# Patient Record
Sex: Male | Born: 1960 | Race: White | Hispanic: No | Marital: Married | Smoking: Former smoker
Health system: Southern US, Community
[De-identification: ages and names within clinical notes are randomized; demographics above are authoritative.]

## PROBLEM LIST (undated history)

## (undated) DIAGNOSIS — Z9889 Other specified postprocedural states: Secondary | ICD-10-CM

## (undated) DIAGNOSIS — M1990 Unspecified osteoarthritis, unspecified site: Secondary | ICD-10-CM

## (undated) DIAGNOSIS — E7800 Pure hypercholesterolemia, unspecified: Secondary | ICD-10-CM

## (undated) DIAGNOSIS — G4730 Sleep apnea, unspecified: Secondary | ICD-10-CM

## (undated) DIAGNOSIS — D649 Anemia, unspecified: Secondary | ICD-10-CM

## (undated) DIAGNOSIS — R112 Nausea with vomiting, unspecified: Secondary | ICD-10-CM

## (undated) DIAGNOSIS — J189 Pneumonia, unspecified organism: Secondary | ICD-10-CM

## (undated) DIAGNOSIS — E039 Hypothyroidism, unspecified: Secondary | ICD-10-CM

## (undated) DIAGNOSIS — T4145XA Adverse effect of unspecified anesthetic, initial encounter: Secondary | ICD-10-CM

## (undated) DIAGNOSIS — T8859XA Other complications of anesthesia, initial encounter: Secondary | ICD-10-CM

## (undated) DIAGNOSIS — I4891 Unspecified atrial fibrillation: Secondary | ICD-10-CM

## (undated) DIAGNOSIS — L4050 Arthropathic psoriasis, unspecified: Secondary | ICD-10-CM

## (undated) DIAGNOSIS — F419 Anxiety disorder, unspecified: Secondary | ICD-10-CM

## (undated) DIAGNOSIS — I10 Essential (primary) hypertension: Secondary | ICD-10-CM

## (undated) HISTORY — PX: ROTATOR CUFF REPAIR: SHX139

## (undated) HISTORY — PX: EYE SURGERY: SHX253

## (undated) HISTORY — PX: JOINT REPLACEMENT: SHX530

## (undated) HISTORY — PX: TONSILLECTOMY: SUR1361

## (undated) HISTORY — PX: HAND SURGERY: SHX662

---

## 2000-02-15 ENCOUNTER — Ambulatory Visit (HOSPITAL_BASED_OUTPATIENT_CLINIC_OR_DEPARTMENT_OTHER): Admission: RE | Admit: 2000-02-15 | Discharge: 2000-02-15 | Payer: Self-pay | Admitting: Otolaryngology

## 2010-02-14 ENCOUNTER — Encounter: Admission: RE | Admit: 2010-02-14 | Discharge: 2010-02-14 | Payer: Self-pay | Admitting: Orthopedic Surgery

## 2010-03-11 ENCOUNTER — Encounter: Admission: RE | Admit: 2010-03-11 | Discharge: 2010-03-11 | Payer: Self-pay | Admitting: Orthopedic Surgery

## 2011-01-06 NOTE — Op Note (Signed)
[place]  Patient:    Sean Miles, Sean Miles                         MRN: [id number] Proc. Date: 02/15/00 Adm. Date:  [id number] Attending:  Milly Jakob Dictator:   Harvie Junior, M.D. CC:         Harvie Junior, M.D.                           Operative Report  PREOPERATIVE DIAGNOSIS:  Degenerative joint disease with medial meniscal tear.  POSTOPERATIVE DIAGNOSIS:  Degenerative joint disease with medial meniscal tear.  PROCEDURE: 1. Partial posterior horn medial meniscectomy. 2. Medial femoral chondroplasty.  SURGEON:  Dr. Luiz Blare.  ANESTHESIA:  General.  HISTORY OF PRESENT ILLNESS:  He is a 50 year old male with a long history of bilateral knee pain.  He has had intermittent pain of both knees with occasional episodes of feeling of locking, giving out, occasional swelling off and on.  Because it continued and persistent complaints of pain, he came to the operating room for evaluation under anesthesia and arthroscopy.  DESCRIPTION OF PROCEDURE:  The patient was brought to the operating room, and after adequate anesthesia was obtained with a general anesthetic the patient was placed supine on the operating table.  The right leg was then prepped and draped in the usual sterile fashion.  Following this, routine arthroscopic examination of the knee revealed that there was some lateral tracking of the patella, although no chondromalacia of the lateral femoral condyle or in the lateral patellar facet.  Attention was then turned down to the medial weightbearing compartment where there was noted to be significant flaps of cartilage in the medial femoral condyle with chondromalacia.  This is a standard location for degenerative arthritis.  This was debrided with a suction shaver to a smooth surface, and the leg was flexed completely to get a very good look at the femoral condyle.  This was a fairly large area, probably the size of a silver dollar.   Following this, attention was turned to the medial meniscus where there was a posterior horn radial tear causing the posterior horn to be unstable.  This was debrided with a combination of the straight-biting forceps and a up-biting forceps and the remaining ______ was contoured down with a suction shaver.  Following this, attention was turned to the notch where the anterior cruciate was noted to be within normal limits. The lateral femoral condyle was noted to be within normal limits except for one little small nick.  The lateral meniscus was without abnormality.  At this point the knee was copiously irrigated and suctioned dry.  Final check was made for any loose fragment and pieces.  The portals were then closed with Steri-Strips, 20 cc of 0.25% Marcaine was instilled for postoperative anesthesia.  A gentle compressive dressing was then applied.  The patient was taken to the recovery room and noted to be in satisfactory condition.  ESTIMATED BLOOD LOSS:  None. DD:  02/15/00 TD:  02/16/00 Job: [id number]

## 2011-04-28 ENCOUNTER — Other Ambulatory Visit: Payer: Self-pay | Admitting: Orthopedic Surgery

## 2011-04-28 DIAGNOSIS — M545 Low back pain, unspecified: Secondary | ICD-10-CM

## 2011-05-15 ENCOUNTER — Ambulatory Visit
Admission: RE | Admit: 2011-05-15 | Discharge: 2011-05-15 | Disposition: A | Discharge status: Home / Self Care | Payer: 59 | Source: Ambulatory Visit | Attending: Orthopedic Surgery | Admitting: Orthopedic Surgery

## 2011-05-15 DIAGNOSIS — M545 Low back pain, unspecified: Secondary | ICD-10-CM

## 2011-07-06 ENCOUNTER — Encounter (HOSPITAL_COMMUNITY): Payer: Self-pay | Admitting: Pharmacy Technician

## 2011-07-11 ENCOUNTER — Encounter (HOSPITAL_COMMUNITY)
Admission: RE | Admit: 2011-07-11 | Discharge: 2011-07-11 | Disposition: A | Discharge status: Home / Self Care | Payer: 59 | Source: Ambulatory Visit | Attending: Orthopedic Surgery | Admitting: Orthopedic Surgery

## 2011-07-11 ENCOUNTER — Encounter (HOSPITAL_COMMUNITY): Payer: Self-pay

## 2011-07-11 ENCOUNTER — Encounter (HOSPITAL_COMMUNITY)
Admission: RE | Admit: 2011-07-11 | Discharge: 2011-07-11 | Disposition: A | Discharge status: Home / Self Care | Payer: 59 | Source: Ambulatory Visit | Attending: Physician Assistant | Admitting: Physician Assistant

## 2011-07-11 HISTORY — DX: Pneumonia, unspecified organism: J18.9

## 2011-07-11 HISTORY — DX: Essential (primary) hypertension: I10

## 2011-07-11 HISTORY — DX: Pure hypercholesterolemia, unspecified: E78.00

## 2011-07-11 HISTORY — DX: Adverse effect of unspecified anesthetic, initial encounter: T41.45XA

## 2011-07-11 HISTORY — DX: Unspecified osteoarthritis, unspecified site: M19.90

## 2011-07-11 HISTORY — DX: Anxiety disorder, unspecified: F41.9

## 2011-07-11 HISTORY — DX: Sleep apnea, unspecified: G47.30

## 2011-07-11 HISTORY — DX: Hypothyroidism, unspecified: E03.9

## 2011-07-11 HISTORY — DX: Other complications of anesthesia, initial encounter: T88.59XA

## 2011-07-11 LAB — CBC
Hemoglobin: 14.5 g/dL (ref 13.0–17.0)
MCH: 30.3 pg (ref 26.0–34.0)
MCHC: 34.9 g/dL (ref 30.0–36.0)
MCV: 86.8 fL (ref 78.0–100.0)
Platelets: 339 10*3/uL (ref 150–400)
RBC: 4.78 MIL/uL (ref 4.22–5.81)

## 2011-07-11 LAB — COMPREHENSIVE METABOLIC PANEL
CO2: 26 mEq/L (ref 19–32)
Calcium: 10 mg/dL (ref 8.4–10.5)
Creatinine, Ser: 0.83 mg/dL (ref 0.50–1.35)
GFR calc Af Amer: >90 mL/min (ref >90)
GFR calc non Af Amer: >90 mL/min (ref >90)
Glucose, Bld: 104 mg/dL — ABNORMAL HIGH (ref 70–99)
Sodium: 135 mEq/L (ref 135–145)
Total Protein: 7.4 g/dL (ref 6.0–8.3)

## 2011-07-11 LAB — DIFFERENTIAL
Basophils Absolute: 0 10*3/uL (ref 0.0–0.1)
Lymphocytes Relative: 37 % (ref 12–46)
Lymphs Abs: 3.5 10*3/uL (ref 0.7–4.0)
Neutro Abs: 5 10*3/uL (ref 1.7–7.7)

## 2011-07-11 LAB — PROTIME-INR
INR: 1 (ref 0.00–1.49)
Prothrombin Time: 13.4 seconds (ref 11.6–15.2)

## 2011-07-11 LAB — SURGICAL PCR SCREEN
MRSA, PCR: NEGATIVE
Staphylococcus aureus: NEGATIVE

## 2011-07-11 NOTE — Pre-Procedure Instructions (Signed)
20 TAFT WORTHING  07/11/2011   Your procedure is scheduled on:  Wednesday July 19, 2011  Report to [facility] Stay [facility] at 0630 AM.  Call this number if you have problems the morning of surgery: [phone]   Remember:   Do not eat food:After Midnight.  Do not drink clear liquids: 4 Hours before arrival. (may have clear liquids:  Soda, tea, black coffee, grape juice, apple juice, broth, water up to 2:30am)  Take these medicines the morning of surgery with A SIP OF WATER: amlodipine, fenofibrate, hydrocodone, armour, xanax   Do not wear jewelry, make-up or nail polish.  Do not wear lotions, powders, or perfumes. You may wear deodorant.  Do not shave 48 hours prior to surgery.  Do not bring valuables to the hospital.  Contacts, dentures or bridgework may not be worn into surgery.  Leave suitcase in the car. After surgery it may be brought to your room.  For patients admitted to the hospital, checkout time is 11:00 AM the day of discharge.   Patients discharged the day of surgery will not be allowed to drive home.  Name and phone number of your driver: Kiyon Fidalgo [phone]  Special Instructions: CHG Shower Use Special Wash: 1/2 bottle night before surgery and 1/2 bottle morning of surgery.   Please read over the following fact sheets that you were given: Pain Booklet, Coughing and Deep Breathing, Blood Transfusion Information, MRSA Information and Surgical Site Infection Prevention  [phone]

## 2011-07-11 NOTE — Progress Notes (Signed)
Ekg on chart from 06/12/2011 from primary care office Dr. Hyacinth Meeker.

## 2011-07-11 NOTE — H&P (Signed)
Sean Miles 07/11/2011 8:17 AM Location: [facility] Patient #: [id number] DOB: 1961-04-24 Married / Language: [place] / Race: White Male   History of Present Illness(Iza Preston Dierdre Highman, PA-C; 07/11/2011 9:40 AM) The patient is a 50 year old male who comes in today for a preoperative History and Physical. The patient is scheduled for a XLIF with supplemental PSFI (for back and predominately right leg pain) to be performed by Dr. Debria Garret D. Shon Baton, MD at [facility] on Wednesday, July 19, 2011 at 8:30AM .    Family History(Jarren Para J [facility], PA-C; 07/11/2011 9:47 AM) Cerebrovascular Accident. father Chronic Obstructive Lung Disease. mother Congestive Heart Failure. mother Diabetes Mellitus. father Heart disease in male family member before age 48 Hypertension. father Osteoarthritis. mother Rheumatoid Arthritis. mother Severe allergy. sister   Social History(Donyale Berthold J [facility], PA-C; 07/11/2011 9:47 AM) Alcohol use. Moderate alcohol use. current drinker; drinks beer; 8-14 per week Children. 2 Current work status. working full time Drug/Alcohol Rehab (Currently). no Drug/Alcohol Rehab (Previously). no Exercise. Exercises rarely; does running / walking and individual sport Illicit drug use. no Living situation. live with spouse Marital status. married Number of flights of stairs before winded. 2-3 Tobacco / smoke exposure. no Tobacco use. Never smoker. former smoker; smoke(d) less than 1/2 pack(s) per day   Medication History(Chaniqua Brisby J Khaya Theissen, PA-C; 07/11/2011 9:31 AM) Methotrexate (2.5MG  Tablet, 6 Oral once weekly) Active. Norco (5-325MG  Tablet, 1 Oral tid) Active. Armour Thyroid ( Oral) Specific dose unknown - Active. Pravastatin Sodium (80MG  Tablet, 1 Oral daily) Active. AmLODIPine Besylate (5MG  Tablet, 1 Oral daily) Active. Benazepril-Hydrochlorothiazide (20-12.5MG  Tablet, 1 Oral daily) Active. Fenofibrate (200MG  Capsule, 1 Oral  daily) Active. Folic Acid (1MG  Tablet, 1 Oral daily) Active. Xanax (0.5MG  Tablet, 1-2 Oral daily) Active.   Past Surgical History(Letrell Attwood J [facility], PA-C; 07/11/2011 9:47 AM) Rotator Cuff Repair. left Total Knee Replacement. right   Other Problems(Kashon Kraynak J [facility], PA-C; 07/11/2011 9:47 AM) Anxiety Disorder Chronic Pain High blood pressure Hypercholesterolemia Hypothyroidism Other disease, cancer, significant illness Sleep Apnea   Review of Systems(Sabriya Yono J [facility], PA-C; 07/11/2011 9:47 AM) General:Present- Night Sweats and Fatigue. Not Present- Chills, Fever, Appetite Loss, Feeling sick, Weight Gain and Weight Loss. Skin:Not Present- Itching, Rash, Skin Color Changes, Ulcer, Psoriasis and Change in Hair or Nails. HEENT:Not Present- Sensitivity to light, Hearing problems, Nose Bleed and Ringing in the Ears. Neck:Not Present- Swollen Glands and Neck Mass. Respiratory:Not Present- Snoring, Chronic Cough, Bloody sputum and Dyspnea. Cardiovascular:Not Present- Shortness of Breath, Chest Pain, Swelling of Extremities, Leg Cramps and Palpitations. Gastrointestinal:Present- Nausea. Not Present- Bloody Stool, Heartburn, Abdominal Pain, Vomiting and Incontinence of Stool. Male Genitourinary:Not Present- Blood in Urine, Frequency, Incontinence and Nocturia. Musculoskeletal:Present- Joint Stiffness, Joint Swelling, Joint Pain and Back Pain. Not Present- Muscle Weakness and Muscle Pain. Neurological:Not Present- Tingling, Numbness, Burning, Tremor, Headaches and Dizziness. Psychiatric:Present- Anxiety. Not Present- Depression and Memory Loss. Endocrine:Not Present- Cold Intolerance, Heat Intolerance, Excessive hunger and Excessive Thirst. Hematology:Not Present- Abnormal Bleeding, Anemia, Blood Clots and Easy Bruising.   Vitals(Kerri W Maze; 07/11/2011 9:00 AM) 07/11/2011 8:55 AM Weight: 285 lb Height: 70 in Body Surface Area: 2.53 m Body Mass Index: 40.89 kg/m BP:  150/88 (Sitting, Left Arm, Large)    Physical Exam(Alam Guterrez J [facility], PA-C; 07/11/2011 9:59 AM) The physical exam findings are as follows:   General General Appearance- pleasant. Not in acute distress. Orientation- Oriented X3. Build & Nutrition- Well nourished and Well developed. Posture- Normal posture. Gait- Normal. Mental Status- Alert.   Integumentary Lumbar Spine- Skin  examination of the lumbar spine is without deformity, skin lesions, lacerations or abrasions.   Head and Neck Neck Global Assessment- supple. no lymphadenopathy and no nucchal rigidty.   Eye Pupil- Bilateral- Normal, Direct reaction to light normal, Equal and Regular. Motion- Bilateral- EOMI.   Chest and Lung Exam Auscultation: Breath sounds:- Clear.   Cardiovascular Auscultation:Rhythm- Regular rate and rhythm. Heart Sounds- Normal heart sounds.   Abdomen Palpation/Percussion:Palpation and Percussion of the abdomen reveal - Non Tender, No Rebound tenderness and Soft.   Peripheral Vascular Lower Extremity:Inspection- Bilateral- Inspection Normal. Palpation:Posterior tibial pulse- Bilateral- 2+. Dorsalis pedis pulse- Bilateral- 2+.   Neurologic Sensation:Lower Extremity- Bilateral- sensation is intact in the lower extremity. Reflexes:Patellar Reflex- Bilateral- 0 (unequivical). Achilles Reflex- Bilateral- 0 (unequivical). Babinski- Bilateral- Babinski not present. Clonus- Bilateral- clonus not present. Hoffman's Sign- Bilateral- Hoffman's sign not present. Testing:Seated Straight Leg Raise- Bilateral- Seated straight leg raise negative.   Musculoskeletal Spine/Ribs/Pelvis Lumbosacral Spine:Inspection and Palpation- Tenderness- generalized. bony and soft tissue palpation of the lumbar spine and SI joint does not recreate their typical pain. Strength and Tone: Strength:Hip Flexion- Bilateral- 5/5. Knee Extension- Bilateral- 5/5.  Knee Flexion- Bilateral- 5/5. Ankle Dorsiflexion- Bilateral- 5/5. Ankle Plantarflexion- Bilateral- 5/5. Heel walk- Bilateral- able to heel walk without difficulty. Toe Walk- Bilateral- able to walk on toes without difficulty. Heel-Toe Walk- Bilateral- able to heel-toe walk without difficulty. ROM- Flexion- full range of motion. Extension- full range of motion. Pain:- neither flexion or extension is more painful than the other. Waddell's Signs- no Waddell's signs present. Lower Extremity Range of Motion:- No true hip, knee or ankle pain with range of motion. Gait and Station:Assistive Devices- no assistive devices.   Assessment & Plan(Leela Vanbrocklin J Annaelle Kasel, PA-C; 07/11/2011 9:19 AM) Lumbar/Lumbosacral Disc Degeneration (722.52)  Note: Unfortunately conservative measures consisiting of observation, activity modification, injection therapy and oral pain medications have failed to alleviate his symptoms and because of the ongoing horrific back and predominately right leg pain wishes to proceed with surgical intervention.   MRI of the lumbar spine dated 05/15/11 demonstrates DDD with scoliosis L2-4 causing moderate to severe right lateral recess stenosis and foraminal stenosis. L3-4 foraminal disc protrusion.  Risk/benefits/alternatives to surgery and expectations following surgery have been discussed with the patient by Dr. Shon Baton. He has been medically cleared by his PCP. He is scheduled to complete his pre-op hospital requirements today at 11:00AM. He has not yet been fitted for a corsett brace. PT should be contacting him to set this up prior to surgery.   Plan, at this time is to proceed with surgery as scheduled. All of his questions were encouraged, addressed and answered.   Signed electronically by Gwinda Maine, PA-C (07/11/2011 10:00 AM)

## 2011-07-12 NOTE — Progress Notes (Signed)
SPOKE WITH DR Shon Baton REGARDING CLARIFICATION OF  "PSFI"....POSTERIOR SPINE FUSION INSTRUMENTATION....Marland KitchenNEW PERMIT NEEDS TO BE SIGNED DOS

## 2011-07-18 MED ORDER — CEFAZOLIN SODIUM-DEXTROSE 2-3 GM-% IV SOLR
2.0000 g | INTRAVENOUS | Status: AC
  Administered 2011-07-19: 2 g via INTRAVENOUS
  Filled 2011-07-18: qty 50

## 2011-07-18 MED ORDER — LACTATED RINGERS IV SOLN: INTRAVENOUS | Status: DC

## 2011-07-19 ENCOUNTER — Inpatient Hospital Stay (HOSPITAL_COMMUNITY): Payer: 59

## 2011-07-19 ENCOUNTER — Other Ambulatory Visit: Payer: Self-pay

## 2011-07-19 ENCOUNTER — Inpatient Hospital Stay (HOSPITAL_COMMUNITY): Payer: 59 | Admitting: Certified Registered"

## 2011-07-19 ENCOUNTER — Encounter (HOSPITAL_COMMUNITY)
Admission: RE | Disposition: A | Discharge status: Home Health | Payer: Self-pay | Source: Ambulatory Visit | Attending: Orthopedic Surgery

## 2011-07-19 ENCOUNTER — Encounter (HOSPITAL_COMMUNITY): Payer: Self-pay | Admitting: Certified Registered"

## 2011-07-19 ENCOUNTER — Inpatient Hospital Stay (HOSPITAL_COMMUNITY)
Admission: RE | Admit: 2011-07-19 | Discharge: 2011-07-23 | DRG: 454 | Disposition: A | Discharge status: Home Health | Payer: 59 | Source: Ambulatory Visit | Attending: Orthopedic Surgery | Admitting: Orthopedic Surgery

## 2011-07-19 DIAGNOSIS — M5137 Other intervertebral disc degeneration, lumbosacral region: Principal | ICD-10-CM | POA: Diagnosis present

## 2011-07-19 DIAGNOSIS — F411 Generalized anxiety disorder: Secondary | ICD-10-CM | POA: Diagnosis present

## 2011-07-19 DIAGNOSIS — I498 Other specified cardiac arrhythmias: Secondary | ICD-10-CM | POA: Diagnosis not present

## 2011-07-19 DIAGNOSIS — Z96659 Presence of unspecified artificial knee joint: Secondary | ICD-10-CM

## 2011-07-19 DIAGNOSIS — M4120 Other idiopathic scoliosis, site unspecified: Secondary | ICD-10-CM | POA: Diagnosis present

## 2011-07-19 DIAGNOSIS — R000 Tachycardia, unspecified: Secondary | ICD-10-CM

## 2011-07-19 DIAGNOSIS — G8929 Other chronic pain: Secondary | ICD-10-CM | POA: Diagnosis present

## 2011-07-19 DIAGNOSIS — E785 Hyperlipidemia, unspecified: Secondary | ICD-10-CM

## 2011-07-19 DIAGNOSIS — Z6841 Body Mass Index (BMI) 40.0 and over, adult: Secondary | ICD-10-CM

## 2011-07-19 DIAGNOSIS — G4730 Sleep apnea, unspecified: Secondary | ICD-10-CM

## 2011-07-19 DIAGNOSIS — M51379 Other intervertebral disc degeneration, lumbosacral region without mention of lumbar back pain or lower extremity pain: Principal | ICD-10-CM | POA: Diagnosis present

## 2011-07-19 DIAGNOSIS — E669 Obesity, unspecified: Secondary | ICD-10-CM | POA: Diagnosis present

## 2011-07-19 DIAGNOSIS — E039 Hypothyroidism, unspecified: Secondary | ICD-10-CM

## 2011-07-19 DIAGNOSIS — Z79899 Other long term (current) drug therapy: Secondary | ICD-10-CM

## 2011-07-19 DIAGNOSIS — I10 Essential (primary) hypertension: Secondary | ICD-10-CM

## 2011-07-19 HISTORY — PX: LUMBAR FUSION: SHX111

## 2011-07-19 HISTORY — PX: ANTERIOR LAT LUMBAR FUSION: SHX1168

## 2011-07-19 LAB — CBC
HCT: 34.3 % — ABNORMAL LOW (ref 39.0–52.0)
Hemoglobin: 11.7 g/dL — ABNORMAL LOW (ref 13.0–17.0)
MCH: 29.8 pg (ref 26.0–34.0)
MCV: 87.5 fL (ref 78.0–100.0)
Platelets: 296 10*3/uL (ref 150–400)
RBC: 3.92 MIL/uL — ABNORMAL LOW (ref 4.22–5.81)
WBC: 13.7 10*3/uL — ABNORMAL HIGH (ref 4.0–10.5)

## 2011-07-19 LAB — BASIC METABOLIC PANEL
CO2: 24 mEq/L (ref 19–32)
Chloride: 99 mEq/L (ref 96–112)
Creatinine, Ser: 0.77 mg/dL (ref 0.50–1.35)
Glucose, Bld: 158 mg/dL — ABNORMAL HIGH (ref 70–99)

## 2011-07-19 LAB — CARDIAC PANEL(CRET KIN+CKTOT+MB+TROPI)
Relative Index: 0.5 (ref 0.0–2.5)
Total CK: 2629 U/L — ABNORMAL HIGH (ref 7–232)

## 2011-07-19 LAB — ABO/RH: ABO/RH(D): O POS

## 2011-07-19 LAB — TYPE AND SCREEN: ABO/RH(D): O POS

## 2011-07-19 SURGERY — ANTERIOR LATERAL LUMBAR FUSION 2 LEVELS
Anesthesia: General | Wound class: Clean

## 2011-07-19 MED ORDER — LACTATED RINGERS IV SOLN
INTRAVENOUS | Status: DC | PRN
  Administered 2011-07-19 (×5): via INTRAVENOUS

## 2011-07-19 MED ORDER — SODIUM CHLORIDE 0.9 % IJ SOLN
3.0000 mL | Freq: Two times a day (BID) | INTRAMUSCULAR | Status: DC
  Administered 2011-07-20 – 2011-07-22 (×5): 3 mL via INTRAVENOUS

## 2011-07-19 MED ORDER — AMLODIPINE BESYLATE 5 MG PO TABS
5.0000 mg | ORAL_TABLET | Freq: Every day | ORAL | Status: DC
  Administered 2011-07-20 – 2011-07-23 (×4): 5 mg via ORAL
  Filled 2011-07-19 (×4): qty 1

## 2011-07-19 MED ORDER — ACETAMINOPHEN 10 MG/ML IV SOLN
1000.0000 mg | Freq: Four times a day (QID) | INTRAVENOUS | Status: AC
  Administered 2011-07-19 – 2011-07-20 (×3): 1000 mg via INTRAVENOUS
  Filled 2011-07-19 (×4): qty 100

## 2011-07-19 MED ORDER — SODIUM CHLORIDE 0.9 % IJ SOLN: 3.0000 mL | INTRAMUSCULAR | Status: DC | PRN

## 2011-07-19 MED ORDER — HYDRALAZINE HCL 20 MG/ML IJ SOLN
INTRAMUSCULAR | Status: DC | PRN
  Administered 2011-07-19 (×2): 5 mg via INTRAVENOUS

## 2011-07-19 MED ORDER — SODIUM CHLORIDE 0.9 % IV SOLN: 250.0000 mL | INTRAVENOUS | Status: DC

## 2011-07-19 MED ORDER — DEXTROSE 5 % IV SOLN
500.0000 mg | Freq: Once | INTRAVENOUS | Status: DC
  Filled 2011-07-19: qty 5

## 2011-07-19 MED ORDER — THYROID 30 MG PO TABS
32.5000 mg | ORAL_TABLET | Freq: Every day | ORAL | Status: DC
  Administered 2011-07-20 – 2011-07-23 (×4): 30 mg via ORAL
  Filled 2011-07-19 (×4): qty 1

## 2011-07-19 MED ORDER — ONDANSETRON HCL 4 MG/2ML IJ SOLN
4.0000 mg | INTRAMUSCULAR | Status: DC | PRN
  Administered 2011-07-19: 4 mg via INTRAVENOUS
  Filled 2011-07-19: qty 2

## 2011-07-19 MED ORDER — MORPHINE SULFATE 10 MG/ML IJ SOLN
INTRAMUSCULAR | Status: DC | PRN
  Administered 2011-07-19 (×2): 5 mg via INTRAVENOUS

## 2011-07-19 MED ORDER — SURGIFOAM 100 EX MISC
CUTANEOUS | Status: DC | PRN
  Administered 2011-07-19: 10:00:00 via TOPICAL

## 2011-07-19 MED ORDER — MORPHINE SULFATE 4 MG/ML IJ SOLN
1.0000 mg | INTRAMUSCULAR | Status: DC | PRN
  Administered 2011-07-19 – 2011-07-20 (×2): 4 mg via INTRAVENOUS
  Administered 2011-07-20: 2 mg via INTRAVENOUS
  Filled 2011-07-19 (×3): qty 1

## 2011-07-19 MED ORDER — MAGNESIUM HYDROXIDE 400 MG/5ML PO SUSP
30.0000 mL | Freq: Two times a day (BID) | ORAL | Status: DC | PRN
  Administered 2011-07-22: 30 mL via ORAL
  Filled 2011-07-19: qty 30

## 2011-07-19 MED ORDER — PROPOFOL 10 MG/ML IV EMUL
INTRAVENOUS | Status: DC | PRN
  Administered 2011-07-19: 200 mg via INTRAVENOUS

## 2011-07-19 MED ORDER — DEXAMETHASONE SODIUM PHOSPHATE 4 MG/ML IJ SOLN
4.0000 mg | Freq: Four times a day (QID) | INTRAMUSCULAR | Status: DC
  Administered 2011-07-19 – 2011-07-23 (×16): 4 mg via INTRAVENOUS
  Filled 2011-07-19 (×20): qty 1

## 2011-07-19 MED ORDER — METOPROLOL TARTRATE 1 MG/ML IV SOLN
INTRAVENOUS | Status: DC | PRN
  Administered 2011-07-19 (×2): 2 mg via INTRAVENOUS
  Administered 2011-07-19: 1 mg via INTRAVENOUS
  Administered 2011-07-19: 3 mg via INTRAVENOUS
  Administered 2011-07-19: 2 mg via INTRAVENOUS
  Administered 2011-07-19: 1 mg via INTRAVENOUS
  Administered 2011-07-19: 2 mg via INTRAVENOUS
  Administered 2011-07-19 (×2): 1 mg via INTRAVENOUS

## 2011-07-19 MED ORDER — DROPERIDOL 2.5 MG/ML IJ SOLN: 0.6250 mg | Freq: Once | INTRAMUSCULAR | Status: DC

## 2011-07-19 MED ORDER — DEXAMETHASONE SODIUM PHOSPHATE 4 MG/ML IJ SOLN
INTRAMUSCULAR | Status: DC | PRN
  Administered 2011-07-19: 10 mg via INTRAVENOUS

## 2011-07-19 MED ORDER — BUPIVACAINE HCL 0.25 % IJ SOLN
INTRAMUSCULAR | Status: DC | PRN
  Administered 2011-07-19: 15 mL

## 2011-07-19 MED ORDER — DROPERIDOL 2.5 MG/ML IJ SOLN: 0.6250 mg | INTRAMUSCULAR | Status: DC | PRN

## 2011-07-19 MED ORDER — METHOCARBAMOL 500 MG PO TABS
500.0000 mg | ORAL_TABLET | Freq: Four times a day (QID) | ORAL | Status: DC | PRN
  Administered 2011-07-20 – 2011-07-23 (×9): 500 mg via ORAL
  Filled 2011-07-19 (×11): qty 1

## 2011-07-19 MED ORDER — CEFAZOLIN SODIUM 1-5 GM-% IV SOLN
1.0000 g | Freq: Three times a day (TID) | INTRAVENOUS | Status: AC
  Administered 2011-07-19 – 2011-07-20 (×2): 1 g via INTRAVENOUS
  Filled 2011-07-19 (×3): qty 50

## 2011-07-19 MED ORDER — SIMVASTATIN 5 MG PO TABS
5.0000 mg | ORAL_TABLET | Freq: Every day | ORAL | Status: DC
  Administered 2011-07-20 – 2011-07-22 (×3): 5 mg via ORAL
  Filled 2011-07-19 (×4): qty 1

## 2011-07-19 MED ORDER — ZOLPIDEM TARTRATE 10 MG PO TABS: 10.0000 mg | ORAL_TABLET | Freq: Every evening | ORAL | Status: DC | PRN

## 2011-07-19 MED ORDER — FENTANYL CITRATE 0.05 MG/ML IJ SOLN
INTRAMUSCULAR | Status: DC | PRN
  Administered 2011-07-19: 100 ug via INTRAVENOUS
  Administered 2011-07-19: 50 ug via INTRAVENOUS
  Administered 2011-07-19: 100 ug via INTRAVENOUS
  Administered 2011-07-19 (×3): 50 ug via INTRAVENOUS
  Administered 2011-07-19 (×3): 100 ug via INTRAVENOUS
  Administered 2011-07-19 (×5): 50 ug via INTRAVENOUS
  Administered 2011-07-19: 100 ug via INTRAVENOUS
  Administered 2011-07-19 (×2): 50 ug via INTRAVENOUS

## 2011-07-19 MED ORDER — BENAZEPRIL HCL 20 MG PO TABS
20.0000 mg | ORAL_TABLET | Freq: Every day | ORAL | Status: DC
  Administered 2011-07-20 – 2011-07-23 (×4): 20 mg via ORAL
  Filled 2011-07-19 (×4): qty 1

## 2011-07-19 MED ORDER — PHENOL 1.4 % MT LIQD
1.0000 | OROMUCOSAL | Status: DC | PRN
  Filled 2011-07-19: qty 177

## 2011-07-19 MED ORDER — DEXTROSE 5 % IV SOLN
500.0000 mg | Freq: Four times a day (QID) | INTRAVENOUS | Status: DC | PRN
  Administered 2011-07-19: 500 mg via INTRAVENOUS
  Filled 2011-07-19: qty 5

## 2011-07-19 MED ORDER — DEXAMETHASONE 4 MG PO TABS: 4.0000 mg | ORAL_TABLET | Freq: Four times a day (QID) | ORAL | Status: DC

## 2011-07-19 MED ORDER — POLYETHYLENE GLYCOL 3350 17 G PO PACK
17.0000 g | PACK | Freq: Every day | ORAL | Status: DC | PRN
  Administered 2011-07-21: 17 g via ORAL
  Filled 2011-07-19: qty 1

## 2011-07-19 MED ORDER — BENAZEPRIL-HYDROCHLOROTHIAZIDE 20-12.5 MG PO TABS: 1.0000 | ORAL_TABLET | Freq: Two times a day (BID) | ORAL | Status: DC

## 2011-07-19 MED ORDER — PROPOFOL 10 MG/ML IV EMUL
INTRAVENOUS | Status: DC | PRN
  Administered 2011-07-19: 75 ug/kg/min via INTRAVENOUS

## 2011-07-19 MED ORDER — HYDROMORPHONE HCL PF 1 MG/ML IJ SOLN
0.2500 mg | INTRAMUSCULAR | Status: DC | PRN
  Administered 2011-07-19 (×4): 0.5 mg via INTRAVENOUS

## 2011-07-19 MED ORDER — ALPRAZOLAM 0.5 MG PO TABS
0.5000 mg | ORAL_TABLET | Freq: Every evening | ORAL | Status: DC | PRN
  Administered 2011-07-20 – 2011-07-22 (×2): 0.5 mg via ORAL
  Filled 2011-07-19 (×3): qty 1

## 2011-07-19 MED ORDER — HYDROCHLOROTHIAZIDE 25 MG PO TABS
25.0000 mg | ORAL_TABLET | Freq: Every day | ORAL | Status: DC
  Administered 2011-07-20 – 2011-07-23 (×4): 25 mg via ORAL
  Filled 2011-07-19 (×4): qty 1

## 2011-07-19 MED ORDER — MIDAZOLAM HCL 5 MG/5ML IJ SOLN
INTRAMUSCULAR | Status: DC | PRN
  Administered 2011-07-19: 2 mg via INTRAVENOUS

## 2011-07-19 MED ORDER — ACETAMINOPHEN 10 MG/ML IV SOLN
1000.0000 mg | Freq: Once | INTRAVENOUS | Status: AC
  Administered 2011-07-19: 1000 mg via INTRAVENOUS
  Filled 2011-07-19: qty 100

## 2011-07-19 MED ORDER — HETASTARCH-ELECTROLYTES 6 % IV SOLN
INTRAVENOUS | Status: DC | PRN
  Administered 2011-07-19: 09:00:00 via INTRAVENOUS

## 2011-07-19 MED ORDER — ROCURONIUM BROMIDE 100 MG/10ML IV SOLN
INTRAVENOUS | Status: DC | PRN
  Administered 2011-07-19: 50 mg via INTRAVENOUS

## 2011-07-19 MED ORDER — MENTHOL 3 MG MT LOZG
1.0000 | LOZENGE | OROMUCOSAL | Status: DC | PRN
  Filled 2011-07-19: qty 9

## 2011-07-19 MED ORDER — LACTATED RINGERS IV SOLN: INTRAVENOUS | Status: DC

## 2011-07-19 MED ORDER — DOCUSATE SODIUM 100 MG PO CAPS
100.0000 mg | ORAL_CAPSULE | Freq: Two times a day (BID) | ORAL | Status: DC
  Administered 2011-07-19 – 2011-07-23 (×8): 100 mg via ORAL
  Filled 2011-07-19 (×10): qty 1

## 2011-07-19 MED ORDER — HEMOSTATIC AGENTS (NO CHARGE) OPTIME
TOPICAL | Status: DC | PRN
  Administered 2011-07-19: 1 via TOPICAL

## 2011-07-19 MED ORDER — ONDANSETRON HCL 4 MG/2ML IJ SOLN
INTRAMUSCULAR | Status: DC | PRN
  Administered 2011-07-19: 4 mg via INTRAVENOUS

## 2011-07-19 SURGICAL SUPPLY — 75 items
APPLIER CLIP 11 MED OPEN (CLIP)
ATTRACTOMAT 16X20 MAGNETIC DRP (DRAPES) ×2 IMPLANT
BLADE SURG 10 STRL SS (BLADE) ×4 IMPLANT
BLADE SURG ROTATE 9660 (MISCELLANEOUS) ×2 IMPLANT
CAGE COUGAR LATERAL 18X10X55 (Cage) ×4 IMPLANT
CLIP APPLIE 11 MED OPEN (CLIP) IMPLANT
CLOSURE STERI STRIP 1/2 X4 (GAUZE/BANDAGES/DRESSINGS) ×4 IMPLANT
CLOTH BEACON ORANGE TIMEOUT ST (SAFETY) ×2 IMPLANT
CORDS BIPOLAR (ELECTRODE) ×2 IMPLANT
COVER SURGICAL LIGHT HANDLE (MISCELLANEOUS) ×2 IMPLANT
DERMABOND ADVANCED (GAUZE/BANDAGES/DRESSINGS)
DERMABOND ADVANCED .7 DNX12 (GAUZE/BANDAGES/DRESSINGS) IMPLANT
DRAPE C-ARM 42X72 X-RAY (DRAPES) ×2 IMPLANT
DRAPE C-ARMOR (DRAPES) IMPLANT
DRAPE ORTHO SPLIT 77X108 STRL (DRAPES) ×1
DRAPE POUCH INSTRU U-SHP 10X18 (DRAPES) ×2 IMPLANT
DRAPE SURG 17X23 STRL (DRAPES) IMPLANT
DRAPE SURG ORHT 6 SPLT 77X108 (DRAPES) ×1 IMPLANT
DRAPE U-SHAPE 47X51 STRL (DRAPES) ×4 IMPLANT
DRSG MEPILEX BORDER 4X12 (GAUZE/BANDAGES/DRESSINGS) ×2 IMPLANT
DRSG MEPILEX BORDER 4X8 (GAUZE/BANDAGES/DRESSINGS) IMPLANT
DURAPREP 26ML APPLICATOR (WOUND CARE) ×2 IMPLANT
ELECT BLADE 4.0 EZ CLEAN MEGAD (MISCELLANEOUS) ×2
ELECT CAUTERY BLADE 6.4 (BLADE) ×2 IMPLANT
ELECT REM PT RETURN 9FT ADLT (ELECTROSURGICAL) ×2
ELECTRODE BLDE 4.0 EZ CLN MEGD (MISCELLANEOUS) ×1 IMPLANT
ELECTRODE REM PT RTRN 9FT ADLT (ELECTROSURGICAL) ×1 IMPLANT
GAUZE SPONGE 4X4 16PLY XRAY LF (GAUZE/BANDAGES/DRESSINGS) ×2 IMPLANT
GLOVE BIOGEL PI IND STRL 6.5 (GLOVE) ×1 IMPLANT
GLOVE BIOGEL PI IND STRL 8.5 (GLOVE) ×1 IMPLANT
GLOVE BIOGEL PI INDICATOR 6.5 (GLOVE) ×1
GLOVE BIOGEL PI INDICATOR 8.5 (GLOVE) ×1
GLOVE ECLIPSE 6.0 STRL STRAW (GLOVE) ×4 IMPLANT
GLOVE ECLIPSE 8.5 STRL (GLOVE) ×4 IMPLANT
GOWN PREVENTION PLUS XXLARGE (GOWN DISPOSABLE) ×2 IMPLANT
GOWN STRL NON-REIN LRG LVL3 (GOWN DISPOSABLE) ×2 IMPLANT
GUIDEWIRE 1.6X480MM (WIRE) ×6 IMPLANT
GUIDEWIRE PIPELINE IS BLUNT (WIRE) ×4 IMPLANT
KIT BASIN OR (CUSTOM PROCEDURE TRAY) ×2 IMPLANT
KIT ORACLE NEUROMONITING (KITS) ×2 IMPLANT
KIT ROOM TURNOVER OR (KITS) ×2 IMPLANT
MIX DBX 10CC 35% BONE (Bone Implant) ×4 IMPLANT
NEEDLE I-PASS III (NEEDLE) ×2 IMPLANT
NEEDLE SPNL 18GX3.5 QUINCKE PK (NEEDLE) ×2 IMPLANT
NS IRRIG 1000ML POUR BTL (IV SOLUTION) ×2 IMPLANT
PACK LAMINECTOMY ORTHO (CUSTOM PROCEDURE TRAY) ×2 IMPLANT
PACK UNIVERSAL I (CUSTOM PROCEDURE TRAY) ×2 IMPLANT
PAD ARMBOARD 7.5X6 YLW CONV (MISCELLANEOUS) ×6 IMPLANT
ROD MATRIX MIS 80MM (Rod) ×2 IMPLANT
SCREW MATRIX MIS 6.0X40MM (Screw) ×2 IMPLANT
SCREW MATRIX MIS 6.0X45MM (Screw) ×4 IMPLANT
SPONGE INTESTINAL PEANUT (DISPOSABLE) ×4 IMPLANT
SPONGE LAP 4X18 X RAY DECT (DISPOSABLE) ×2 IMPLANT
SPONGE SURGIFOAM ABS GEL 100 (HEMOSTASIS) ×2 IMPLANT
STRIP CLOSURE SKIN 1/2X4 (GAUZE/BANDAGES/DRESSINGS) IMPLANT
SURGIFLO TRUKIT (HEMOSTASIS) ×2 IMPLANT
SUT MNCRL AB 3-0 PS2 18 (SUTURE) ×6 IMPLANT
SUT PROLENE 5 0 C 1 24 (SUTURE) IMPLANT
SUT SILK 2 0 TIES 10X30 (SUTURE) IMPLANT
SUT SILK 3 0 TIES 10X30 (SUTURE) IMPLANT
SUT VIC AB 1 CT1 18XCR BRD 8 (SUTURE) ×2 IMPLANT
SUT VIC AB 1 CT1 27 (SUTURE) ×2
SUT VIC AB 1 CT1 27XBRD ANBCTR (SUTURE) ×2 IMPLANT
SUT VIC AB 1 CT1 8-18 (SUTURE) ×2
SUT VIC AB 1 CTX 36 (SUTURE)
SUT VIC AB 1 CTX36XBRD ANBCTR (SUTURE) IMPLANT
SUT VIC AB 2-0 CT1 18 (SUTURE) ×4 IMPLANT
SYR BULB IRRIGATION 50ML (SYRINGE) ×2 IMPLANT
SYR CONTROL 10ML LL (SYRINGE) ×2 IMPLANT
TAP CANN 5MM (TAP) ×2 IMPLANT
TOWEL OR 17X24 6PK STRL BLUE (TOWEL DISPOSABLE) ×2 IMPLANT
TOWEL OR 17X26 10 PK STRL BLUE (TOWEL DISPOSABLE) ×2 IMPLANT
TRAY FOLEY CATH 14FRSI W/METER (CATHETERS) ×2 IMPLANT
WATER STERILE IRR 1000ML POUR (IV SOLUTION) IMPLANT
YANKAUER SUCT BULB TIP NO VENT (SUCTIONS) ×2 IMPLANT

## 2011-07-19 NOTE — Interval H&P Note (Signed)
History and Physical Interval Note:   07/19/2011   8:26 AM   Sean Miles  has presented today for surgery, with the diagnosis of degenerative lumbar stenosis  The various methods of treatment have been discussed with the patient and family. After consideration of risks, benefits and other options for treatment, the patient has consented to  Procedure(s): ANTERIOR LATERAL LUMBAR FUSION 2 LEVELS as a surgical intervention .  The patients' history has been reviewed, patient examined, no change in status, stable for surgery.  I have reviewed the patients' chart and labs.  Questions were answered to the patient's satisfaction.     Venita Lick D  MD No change in clinical exam or history Reviewed H+P - no changes

## 2011-07-19 NOTE — Anesthesia Postprocedure Evaluation (Signed)
  Anesthesia Post-op Note  Patient: Sean Miles  Procedure(s) Performed:  ANTERIOR LATERAL LUMBAR FUSION 2 LEVELS - Xlif and Posterior Spinal Fusion interbody L2-L4  Patient Location: PACU  Anesthesia Type: General  Level of Consciousness: awake, alert  and oriented  Airway and Oxygen Therapy: Patient Spontanous Breathing and Patient connected to nasal cannula oxygen  Post-op Pain: mild  Post-op Assessment: Post-op Vital signs reviewed and Patient's Cardiovascular Status Stable  Post-op Vital Signs: stable  Complications: No apparent anesthesia complications

## 2011-07-19 NOTE — Transfer of Care (Signed)
Immediate Anesthesia Transfer of Care Note  Patient: Sean Miles  Procedure(s) Performed:  ANTERIOR LATERAL LUMBAR FUSION 2 LEVELS - Xlif and Posterior Spinal Fusion interbody L2-L4  Patient Location: PACU  Anesthesia Type: General  Level of Consciousness: sedated  Airway & Oxygen Therapy: Patient Spontanous Breathing and Patient connected to face mask oxygen  Post-op Assessment: Report given to PACU RN, Post -op Vital signs reviewed and stable, Patient moving all extremities, Patient moving all extremities X 4 and Patient able to stick tongue midline  Post vital signs: Reviewed and stable  Complications: No apparent anesthesia complications

## 2011-07-19 NOTE — Progress Notes (Signed)
Transfer to Telemetry bed per Dr. Noreene Larsson  sec to elevated cardiac enzymes

## 2011-07-19 NOTE — Anesthesia Postprocedure Evaluation (Signed)
  Anesthesia Post-op Note  Patient: Sean Miles  Procedure(s) Performed:  ANTERIOR LATERAL LUMBAR FUSION 2 LEVELS - Xlif and Posterior Spinal Fusion interbody L2-L4  Patient Location: PACU  Anesthesia Type: General  Level of Consciousness: awake, alert  and oriented  Airway and Oxygen Therapy: Patient Spontanous Breathing and Patient connected to nasal cannula oxygen  Post-op Pain: moderate  Post-op Assessment: Post-op Vital signs reviewed and Patient's Cardiovascular Status Stable  Post-op Vital Signs: stable  Complications: No apparent anesthesia complications

## 2011-07-19 NOTE — Anesthesia Procedure Notes (Signed)
Procedure Name: Intubation Date/Time: 07/19/2011 8:52 AM Performed by: Margaree Mackintosh Pre-anesthesia Checklist: Patient identified, Emergency Drugs available, Suction available, Patient being monitored and Timeout performed Patient Re-evaluated:Patient Re-evaluated prior to inductionOxygen Delivery Method: Circle System Utilized Preoxygenation: Pre-oxygenation with 100% oxygen Intubation Type: IV induction Ventilation: Mask ventilation without difficulty and Oral airway inserted - appropriate to patient size Laryngoscope Size: Mac and 3 Grade View: Grade II Tube type: Oral Tube size: 8.0 mm Number of attempts: 1 Airway Equipment and Method: stylet Placement Confirmation: ETT inserted through vocal cords under direct vision,  positive ETCO2 and breath sounds checked- equal and bilateral Secured at: 22 cm Tube secured with: Tape Dental Injury: Teeth and Oropharynx as per pre-operative assessment

## 2011-07-19 NOTE — Op Note (Signed)
OPERATIVE REPORT  DATE OF SURGERY: 07/19/2011  PATIENT NAME:  Sean Miles MRN: [id number] DOB: 02/24/1961  PCP: No primary provider on file.  PRE-OPERATIVE DIAGNOSIS:  Degenerative lumbar stenosis. Degenerative lumbar scoliosis  POST-OPERATIVE DIAGNOSIS:  Same  PROCEDURE:   Lateral L2-3 L3-4 discectomy and interbody fusion utilizing the Depey carbon fiber lateral cage.  Posterior lateral arthrodesis using DBX mix L2-3 L3-4  Posterior segmental instrumentation with Synthes matrix pedicle screw system L2-3 L3-4  SURGEON:  Venita Lick, MD  PHYSICIAN ASSISTANT: Norval Gable   ANESTHESIA:   General  EBL: 300 ml   BRIEF HISTORY: Sean Miles is a 50 y.o. male who presents to my office with significant back pain buttock pain and bilateral leg pain right side worse than the left. Multiple attempts at conservative management and failed to alleviate his pain as a result of the ongoing symptoms we elected to proceed with surgical management.  Conservative management consisting of injection therapy physical therapy anti-inflammatory medications narcotic medications activity modification.  Indications for surgery progressive  loss of quality of life. Radiographic and clinical exam consistent with spinal stenosis with progressive deformity.  PROCEDURE DETAILS: Patient was brought into the operating room. After successful induction of general anesthesia and tracheal intubation a Time Out was done. This confirmed all pertinent important data. Sean Miles's and SCDs and Foley were inserted following the time out.  A narrow stem monitor representative then placed all appropriate lower extremity and upper extremity needles for intraoperative rerun nerve neuro monitoring as well as SSEP monitoring.  The  patient was then turned into the left lateral position (left side up). Axillary roll was placed all bony prominences were well padded the patient was secured with taped to the table.  The lateral  and posterior aspect of the spine was then prepped and draped in standard fashion.  Fluoroscopic the machine was brought sterilely over the field and identified the L2-3 and L3-4 disc space. Once this was confirmed and he incision over the L3 vertebral body and dissected down through the adipose tissue until I reached the fascia of the external oblique muscle.  I then made a second incision 1 fingerbreadth posteriorly and then dissected bluntly down to the fascia overlying the retroperitoneal using a Mordecai Maes IPOP into that bluntly dissected through that fashion then using a finger I began mobilizing the fat and soft tissue that was in the retroperitoneal space I was able to palpate the surface of the  psoas muscle as well as the ventral surface of the transverse processes. Once I was assured that mobilized the arm retroperitoneal ensure that I well I then brought my finger up to the undersurface of the fascia of the external oblique. Bluntly dissected through the external oblique and then advanced my first trocar on top my finger down to the surface of the iliopsoas muscle. I confirmed this position the 18 lateral planes and then stimulated the surface of the iliopsoas muscle over the L3-4 disc space to confirm that I would the location of the lumbar plexus. Once I confirmed its location I then removed the trochars slightly anterior so that I was in the mid just at the midportion of the disc space. I then advanced through the psoas muscle down to the lateral aspect of the disc space I then placed a guidepin through the trocar into the disc space to maintain the position.  I then stimulated the trocar removed in a circumferential fashion to confirm that I was not compressing or traumatizing  the lumbar plexus I then sequentially increased size the trochars with the trocar stimulated circumferentially to confirm that there was no undue stressor trauma to the lumbar plexus.  I then placed the working retractor  over the last trocar. I gently distracted and and then secured to the table. I removed all the trochars and then placed light and in irrigated. I could now clearly visualized and lateral L3-4 disc this I then stimulated at the at the base of the each of the blades of the retractor to confirm that there was no undue stress to the lumbar plexus.  Once this was completed I then checked with AP and lateral fluoroscopy to confirm satisfactory position of of the retractor system.  A 10 blade scalpel is used to incise the annulus and then using a combination of pituitary rongeurs curettes and Kerrison rongeurs I removed all the disc material care was taken not to violate the anterior longitudinal ligament. I then released the annulus from the contralateral side and rasp the endplates until a bleeding subchondral bone. I then trialed sequentially excised excising the size 10 parallel spacers. This allowed me to confirm not only the contralateral release was complete but that I had a satisfactory fit. A 10 parallel trial with a firm and was well fitting so this is the implant that I chose.  This 10 parallel to radial lucent carbon fiber cage was packed with DBX mix. I then irrigated copiously normal saline and checked to ensure that I had satisfactory position I malleted graft to the appropriate depth using fluoroscopy to confirm  I then removed the retracting device irrigated and used bipolar cautery to make sure I had hemostasis.  I then repositioned my retractor over the L2-3 disc space. Again using the same technique of circumferential stimulation I confirm that there was no trauma or stress to the lumbar plexus. Once I had the working trocar in place and secured to the table in the same fashion I had done at L3-4 and performed a complete L2-3 discectomy. I again placed the same size graft as it provide the best trial fit.  This point I again confirmed hemostasis and maintained with bipolar cautery and FloSeal.  The entire incision was wound copiously irrigated with normal saline. I then closed the fascia of the oh leak muscle and then placed a deep subcutaneous running suture to close the adipose tissue superficial 2-0 Vicryl interrupted and 3-0 Monocryl for the skin. The 2 small stab incisions and then used to place my finger to mobilize the retroperitoneal space were also irrigated and closed in layered fashion  with 2-0 Vicryl sutures and a 3-0 Monocryl stitch.  With the patient remaining in the lateral position I identified the lateral border of the L2-3 and 4 pedicles. I then made a posterior lateral incision and bluntly dissected down to the deep fascia. Using an Jamshidi needle advanced per down through the paraspinal muscles to the lateral aspect of the facet complex. Using x-ray confirmed that this was the proper position for starting to cannulate the pedicle. I advanced the trocar into the pedicle. The trocar itself was also attached to the neural stimulation device. Using both x-ray and nerve to confirm that satisfactory trajectory and there was no breach of the pedicle. Once the trocar was properly positioned across the pedicle into the vertebral body a guidepin was placed repeated this same procedure at L3 and L4 diet the left L2-3 and 4 pedicles cannulated with the guidepin.  Over each guidepin  I then tapped with a 50 tap and then L4 I placed a 40 mm long 6 mm diameter pedicle screw. At L2-L3 I placed 45 mm length pedicle screws. Each pedicle screw excellent fixation. I then measured for the rod and placed it secured the top locking nuts a standard fashion. I then decorticated the transverse processes and placed posterior in the posterior lateral gutter the remaining portion of DBX mix bone graft  I irrigated this wound copiously normal saline and then closed the defect with interrupted #1 Vicryl sutures superficial 2-0 Vicryl suture and 3-0 Monocryl for the skin edges. There at the end of the case all  needle and sponge counts were correct there was no adverse intraoperative events the patient remained hemodynamically stable and there was no and abnormal SSEP monitors monitoring events nor were there any significant events with the free running EMG  At the end of the case the patient was transferred to the PACU without incident.  Venita Lick, MD 07/19/2011 2:53 PM

## 2011-07-19 NOTE — Anesthesia Preprocedure Evaluation (Addendum)
Anesthesia Evaluation  Patient identified by MRN, date of birth, ID band Patient awake    Reviewed: Allergy & Precautions, H&P , NPO status , Patient's Chart, lab work & pertinent test results, reviewed documented beta blocker date and time   Airway Mallampati: II  Neck ROM: Full    Dental   Pulmonary sleep apnea , pneumonia , former smoker clear to auscultation  Pulmonary exam normal       Cardiovascular hypertension, Regular Normal    Neuro/Psych    GI/Hepatic   Endo/Other  Hypothyroidism   Renal/GU      Musculoskeletal   Abdominal (+) obese,   Peds  Hematology   Anesthesia Other Findings   Reproductive/Obstetrics                         Anesthesia Physical Anesthesia Plan  ASA: II  Anesthesia Plan: General   Post-op Pain Management:    Induction: Intravenous  Airway Management Planned: Oral ETT  Additional Equipment:   Intra-op Plan:   Post-operative Plan: Extubation in OR  Informed Consent: I have reviewed the patients History and Physical, chart, labs and discussed the procedure including the risks, benefits and alternatives for the proposed anesthesia with the patient or authorized representative who has indicated his/her understanding and acceptance.   Dental advisory given  Plan Discussed with: CRNA and Surgeon  Anesthesia Plan Comments:         Anesthesia Quick Evaluation

## 2011-07-19 NOTE — Preoperative (Signed)
Beta Blockers   Reason not to administer Beta Blockers:Not Applicable. Pt does not take beta blockers at home

## 2011-07-19 NOTE — Brief Op Note (Signed)
07/19/2011  3:08 PM  PATIENT:  Sean Miles  50 y.o. male  PRE-OPERATIVE DIAGNOSIS:  degenerative lumbar stenosis with back and right leg pain  POST-OPERATIVE DIAGNOSIS:  degenerative lumbar stenosis with back and right leg pain  PROCEDURE:  Procedure(s): ANTERIOR LATERAL LUMBAR FUSION 2 LEVELS  SURGEON:  Surgeon(s): Anushree Dorsi D Pharoah Goggins  PHYSICIAN ASSISTANT:   ASSISTANTS: Norval Gable   ANESTHESIA:   general  EBL:  Total I/O In: 4500 [I.V.:4000; IV Piggyback:500] Out: 1040 [Urine:740; Blood:300]  BLOOD ADMINISTERED:none  DRAINS: none   LOCAL MEDICATIONS USED:  MARCAINE 10CC  SPECIMEN:  No Specimen  DISPOSITION OF SPECIMEN:  N/A  COUNTS:  YES  TOURNIQUET:  * No tourniquets in log *  DICTATION: .Dragon Dictation  PLAN OF CARE: Admit to inpatient   PATIENT DISPOSITION:  PACU - hemodynamically stable.   Delay start of Pharmacological VTE agent (>24hrs) due to surgical blood loss or risk of bleeding:  {YES/NO/NOT APPLICABLE:20182

## 2011-07-20 ENCOUNTER — Other Ambulatory Visit: Payer: Self-pay

## 2011-07-20 ENCOUNTER — Inpatient Hospital Stay (HOSPITAL_COMMUNITY): Payer: 59

## 2011-07-20 ENCOUNTER — Encounter (HOSPITAL_COMMUNITY): Payer: Self-pay | Admitting: General Practice

## 2011-07-20 DIAGNOSIS — R000 Tachycardia, unspecified: Secondary | ICD-10-CM

## 2011-07-20 DIAGNOSIS — G4730 Sleep apnea, unspecified: Secondary | ICD-10-CM

## 2011-07-20 DIAGNOSIS — E785 Hyperlipidemia, unspecified: Secondary | ICD-10-CM

## 2011-07-20 DIAGNOSIS — I10 Essential (primary) hypertension: Secondary | ICD-10-CM

## 2011-07-20 DIAGNOSIS — E039 Hypothyroidism, unspecified: Secondary | ICD-10-CM

## 2011-07-20 DIAGNOSIS — I495 Sick sinus syndrome: Secondary | ICD-10-CM

## 2011-07-20 LAB — CARDIAC PANEL(CRET KIN+CKTOT+MB+TROPI)
CK, MB: 17.1 ng/mL (ref 0.3–4.0)
Relative Index: 0.4 (ref 0.0–2.5)
Troponin I: <0.3 ng/mL (ref <0.30)

## 2011-07-20 MED ORDER — OXYCODONE HCL 5 MG PO TABS
15.0000 mg | ORAL_TABLET | ORAL | Status: DC | PRN
  Administered 2011-07-20 – 2011-07-23 (×12): 15 mg via ORAL
  Filled 2011-07-20: qty 2
  Filled 2011-07-20 (×2): qty 3
  Filled 2011-07-20: qty 1
  Filled 2011-07-20: qty 3
  Filled 2011-07-20: qty 2
  Filled 2011-07-20 (×8): qty 3
  Filled 2011-07-20: qty 1

## 2011-07-20 MED ORDER — ALPRAZOLAM ER 0.5 MG PO TB24: 0.5000 mg | ORAL_TABLET | Freq: Two times a day (BID) | ORAL | Status: DC

## 2011-07-20 MED ORDER — MORPHINE SULFATE 4 MG/ML IJ SOLN
4.0000 mg | INTRAMUSCULAR | Status: DC | PRN
  Administered 2011-07-20 – 2011-07-21 (×3): 4 mg via INTRAVENOUS
  Filled 2011-07-20 (×3): qty 1

## 2011-07-20 MED ORDER — ALPRAZOLAM 0.5 MG PO TABS
0.5000 mg | ORAL_TABLET | Freq: Two times a day (BID) | ORAL | Status: DC
  Administered 2011-07-20 – 2011-07-23 (×6): 0.5 mg via ORAL
  Filled 2011-07-20 (×5): qty 1

## 2011-07-20 NOTE — Progress Notes (Signed)
Physical Therapy Evaluation Patient Details Name: Sean Miles MRN: [id number] DOB: 1960/11/03 Today's Date: 07/20/2011  Problem List:  Patient Active Problem List  Diagnoses  . Sinus tachycardia  . Hypothyroidism  . Hyperlipidemia  . HTN (hypertension)  . Sleep apnea    Past Medical History:  Past Medical History  Diagnosis Date  . Complication of anesthesia     post anesthesia low sat rates  . Hypertension     followed by Dr. Hyacinth Meeker (primary)  . Pneumonia   . Sleep apnea     approx. 3 years ago, unsure of where it was done  . Hypothyroidism     takes armour thyroid  . Arthritis     psoriatic  . Hypercholesteremia   . Anxiety     takes xanax 1- 2 times per day   Past Surgical History:  Past Surgical History  Procedure Date  . Rotator cuff repair     left, august 2011  . Joint replacement     right total knee, 2006  . Hand surgery     right hand removal of growth 1991  . Tonsillectomy   . Eye surgery     lasik  . Lumbar fusion 07/19/2011    lateral lumbar fusion     PT Assessment/Plan/Recommendation PT Assessment Clinical Impression Statement: Eval limited by lab values, but okay-ed transfer and limited ambulation with MD and nurse.  Pt tolerated treatment session well, reporting a 4/10 level of pain.  Pt presented to therapy with limited mobility, decreased balance, and cardiopulmonary status limiting activity.  Pt would benefit from therapy in the acute setting to maximize independence and increase functional mobility to prepare for safe d/c home. PT Recommendation/Assessment: Patient will need skilled PT in the acute care venue PT Problem List: Decreased activity tolerance;Decreased balance;Decreased mobility;Decreased knowledge of precautions;Cardiopulmonary status limiting activity;Pain Barriers to Discharge: None PT Therapy Diagnosis : Difficulty walking;Abnormality of gait;Generalized weakness;Acute pain PT Plan PT Frequency: Min 5X/week PT  Treatment/Interventions: Gait training;Stair training;Functional mobility training;Therapeutic exercise;Balance training;Patient/family education PT Recommendation Follow Up Recommendations: Home health PT;24 hour supervision/assistance Equipment Recommended: 3 in 1 bedside comode PT Goals  Acute Rehab PT Goals PT Goal Formulation: With patient Time For Goal Achievement: 7 days Pt will Roll Supine to Left Side: with modified independence PT Goal: Rolling Supine to Left Side - Progress: Progressing toward goal Pt will go Supine/Side to Sit: with modified independence PT Goal: Supine/Side to Sit - Progress: Progressing toward goal Pt will go Sit to Supine/Side: with modified independence PT Goal: Sit to Supine/Side - Progress: Progressing toward goal Pt will Transfer Sit to Stand/Stand to Sit: with modified independence;with upper extremity assist PT Transfer Goal: Sit to Stand/Stand to Sit - Progress: Progressing toward goal Pt will Transfer Bed to Chair/Chair to Bed: with modified independence PT Transfer Goal: Bed to Chair/Chair to Bed - Progress: Progressing toward goal Pt will Ambulate: >150 feet;with modified independence;with least restrictive assistive device;with gait velocity >(comment) ft/second (>1.8 ft/sec to decrease fall risk) PT Goal: Ambulate - Progress: Progressing toward goal Pt will Go Up / Down Stairs: Flight;with modified independence;with rail(s);with least restrictive assistive device PT Goal: Up/Down Stairs - Progress: Not met Additional Goals Additional Goal #1: Pt will be able to verbalize and demonstrate 3/3 back precautions. PT Goal: Additional Goal #1 - Progress: Progressing toward goal  PT Evaluation Precautions/Restrictions  Precautions Precautions: Back Precaution Booklet Issued: Yes (comment) Precaution Comments: Pt was able to name 3/3 of his post-op back precautions Required Braces  or Orthoses: Yes Spinal Brace: Lumbar corset;Applied in sitting  position Restrictions Weight Bearing Restrictions: No Prior Functioning  Home Living Lives With: Spouse Receives Help From: Family Type of Home: House Home Layout: Two level Alternate Level Stairs-Rails: Right Alternate Level Stairs-Number of Steps: flight Home Access: Stairs to enter Secretary/administrator of Steps: 3 Bathroom Shower/Tub: Health visitor: Standard Home Adaptive Equipment: Shower chair with back Prior Function Level of Independence: Independent with basic ADLs;Independent with homemaking with ambulation;Independent with gait;Independent with transfers Able to Take Stairs?: Yes (But with pain) Driving: Yes Vocation: Full time employment Vocation Requirements: Sits at a desk most of the day, can stand up and move around when necessary Leisure: Hobbies-yes (Comment) Comments: Pt wants to get back to playing golf. Cognition Cognition Arousal/Alertness: Awake/alert Overall Cognitive Status: Appears within functional limits for tasks assessed Orientation Level: Oriented X4 Sensation/Coordination Sensation Additional Comments: Pt reports no sensation changes Extremity Assessment RUE Assessment RUE Assessment: Within Functional Limits LUE Assessment LUE Assessment: Within Functional Limits RLE Assessment RLE Assessment: Within Functional Limits LLE Assessment LLE Assessment: Within Functional Limits Mobility (including Balance) Bed Mobility Bed Mobility: Yes Rolling Left: 5: Supervision Rolling Left Details (indicate cue type and reason): Verbal reminders to keep back precautions with roll.  Verbal cues for LE movement and UE placement. Left Sidelying to Sit: 4: Min assist Left Sidelying to Sit Details (indicate cue type and reason): Min A to complete movement.  Verbal cues for LE movement, UE placement, and to maintain back precautions. Sitting - Scoot to Edge of Bed: 5: Supervision Sitting - Scoot to [place] of Bed Details (indicate cue type and  reason): Verbal cues to initiate and for proper posture. Transfers Transfers: Yes Sit to Stand: 4: Min assist;With upper extremity assist;From elevated surface;From bed Sit to Stand Details (indicate cue type and reason): Min A for pt balance and safety.  Verbal cues to initiate while maintaining back precautions. Stand to Sit: 4: Min assist;With upper extremity assist;With armrests;To chair/3-in-1 Stand to Sit Details: Min A for eccentric control.  Verbal cues to maintain back precautions and for slow descent. Ambulation/Gait Ambulation/Gait: Yes Ambulation/Gait Assistance: 4: Min assist Ambulation/Gait Assistance Details (indicate cue type and reason): Minguard A for pt safety. Ambulation Distance (Feet): 4 Feet (Limited ambulation distance per MD orders) Assistive device: None Gait Pattern: Within Functional Limits Gait velocity: Unable to measure secondary to limited ambulation distance Stairs: No Wheelchair Mobility Wheelchair Mobility: No  Posture/Postural Control Posture/Postural Control: No significant limitations Balance Balance Assessed: Yes Static Standing Balance Static Standing - Balance Support: Left upper extremity supported Static Standing - Level of Assistance: 4: Min assist Static Standing - Comment/# of Minutes: Minguard A for pt safety.  Pt stood approximately 4 min. Exercise    End of Session PT - End of Session Equipment Utilized During Treatment: Gait belt;Back brace Activity Tolerance: Patient tolerated treatment well Patient left: in chair;with call bell in reach Nurse Communication: Mobility status for transfers;Mobility status for ambulation General Behavior During Session: [facility] for tasks performed Cognition: [facility] for tasks performed  Lacinda Axon 07/20/2011, 1:07 PM

## 2011-07-20 NOTE — Consult Note (Signed)
CARDIOLOGY CONSULT NOTE  Patient ID: Sean Miles MRN: [id number], DOB/AGE: [birth year]   Admit date: 07/19/2011 Date of Consult: 07/20/2011   Primary Physician: Sigmund Hazel, [employer] Primary Cardiologist: New to [facility] being seen by Dr. Shela Commons. Valli Randol  Pt. Profile:  50 y/o male w/o prior cardiac hx who underwent Lumbar discectomy and fusion yesterday and has had sinus tach since OR.  Problem List:  1. Sinus Tachycardia  Past Medical History  Diagnosis Date  . Complication of anesthesia     post anesthesia low sat rates  . Hypertension     followed by Dr. Hyacinth Meeker (primary)  . Pneumonia   . Sleep apnea     approx. 3 years ago, unsure of where it was done  . Hypothyroidism     takes armour thyroid  . Arthritis     psoriatic  . Hypercholesteremia   . Anxiety     takes xanax 1- 2 times per day    Past Surgical History  Procedure Date  . Rotator cuff repair     left, august 2011  . Joint replacement     right total knee, 2006  . Hand surgery     right hand removal of growth 1991  . Tonsillectomy   . Eye surgery     lasik  . Lumbar fusion 07/19/2011    lateral lumbar fusion      Allergies: No Known Allergies  HPI:   50 yo male w/o prior cardiac hx who underwent L3/L4, L4/L5 discectomy and fusion on 11/28.  Intra-op he was noted to be in sinus tachycardia, which has since persisted in the post-op setting.  Last night cardiac enzymes were drawn and notable for elevated CK and MB with NL index and NL Ti.  Pt was transferred to telemetry and we have been consulted.  He denies chest pain, sob, though does say that he is having "significant" pain related to his surgical site.  He is afebrile.  He was previously active @ home without any h/o c/p, dyspnea, or limitations in activity other than those imposed by back pain.  He denies any lower extremity edema.  EKG dated 07/19/11 at 16:00 hrs shows sinus tach with Q-waves in III and aVF (new change  compared to preop EKG dated 06/12/11).  Telemetry shows consistent sinus tachycardia at rest - 1-teens to 120's.    Inpatient Medications:     . acetaminophen  1,000 mg Intravenous Q6H  . amLODipine  5 mg Oral Daily  . benazepril  20 mg Oral Daily  . ceFAZolin (ANCEF) IV  1 g Intravenous Q8H  . dexamethasone  4 mg Intravenous Q6H  . docusate sodium  100 mg Oral BID  . hydrochlorothiazide  25 mg Oral Daily  . simvastatin  5 mg Oral q1800  . sodium chloride  3 mL Intravenous Q12H  . thyroid  30 mg Oral Daily  . DISCONTD: benazepril-hydrochlorthiazide  1 tablet Oral BID  . DISCONTD: dexamethasone  4 mg Oral Q6H  . DISCONTD: droperidol  0.625 mg Intravenous Once  . DISCONTD: methocarbamol(ROBAXIN) IV  500 mg Intravenous Once    Family History: Father: DM, CVA, HTN Mother: COPD, CHF, OA, RA Brother: CABG age 30   History   Social History  . Marital Status: Married    Spouse Name: N/A    Number of Children: N/A  . Years of Education: N/A   Occupational History  . Not on file.   Social History Main Topics  .  Smoking status: Former Smoker -- 0.2 packs/day for 10 years    Types: Cigarettes    Quit date: 07/19/1985  . Smokeless tobacco: Not on file   Comment: approx. 20 years ago  . Alcohol Use: Yes     2-3 times per weeks  . Drug Use: No  . Sexually Active: Yes   Other Topics Concern  . Not on file   Social History Narrative  . No narrative on file     Review of Systems: General: negative for chills, fever, night sweats or weight changes.  Cardiovascular: negative for chest pain, dyspnea, edema, orthopnea, palpitations, paroxysmal nocturnal dyspnea Respiratory: negative for cough or wheezing Abdominal: Positive for post-op nausea and flatulence, negative for vomiting, diarrhea, bright red blood per rectum, melena, or hematemesis. Neurologic: negative for visual changes, syncope, or dizziness MsK:  Pt reports lower back pain attributed to his surgery. All other  systems reviewed and are otherwise negative except as noted above.  Physical Exam: Blood pressure 155/83, pulse 123, temperature 98.2 F (36.8 C), temperature source Oral, resp. rate 24, height 5\' 10"  (1.778 m), weight 294 lb 15.6 oz (133.8 kg), SpO2 99.00%.  General: Well developed, well nourished, in no acute distress. Head: Normocephalic, atraumatic, sclera non-icteric, no xanthomas, nares are without discharge.  Neck: Supple without bruits.  Neck is thick and difficult to assess JVP. Lungs:  Resp regular and unlabored, CTA. Heart: RRR - tachy, no s3, s4, or murmurs. Abdomen: Obese, soft, non-tender, non-distended, BS + x 4.  Msk:  Strength and tone appears normal for age. Extremities: No clubbing, cyanosis or edema. DP/PT/Radials 2+ and equal bilaterally. Neuro: Alert and oriented X 3. Moves all extremities spontaneously. Psych: Normal affect.   Labs:   Results for orders placed during the hospital encounter of 07/19/11 (from the past 72 hour(s))  TYPE AND SCREEN     Status: Normal   Collection Time   07/19/11  7:40 AM      Component Value Range Comment   ABO/RH(D) O POS      Antibody Screen NEG      Sample Expiration 08/02/2011     ABO/RH     Status: Normal   Collection Time   07/19/11  7:40 AM      Component Value Range Comment   ABO/RH(D) O POS     CBC     Status: Abnormal   Collection Time   07/19/11  4:31 PM      Component Value Range Comment   WBC 13.7 (*) 4.0 - 10.5 (K/uL)    RBC 3.92 (*) 4.22 - 5.81 (MIL/uL)    Hemoglobin 11.7 (*) 13.0 - 17.0 (g/dL)    HCT 47.8 (*) 29.5 - 52.0 (%)    MCV 87.5  78.0 - 100.0 (fL)    MCH 29.8  26.0 - 34.0 (pg)    MCHC 34.1  30.0 - 36.0 (g/dL)    RDW 62.1  30.8 - 65.7 (%)    Platelets 296  150 - 400 (K/uL)   BASIC METABOLIC PANEL     Status: Abnormal   Collection Time   07/19/11  4:31 PM      Component Value Range Comment   Sodium 136  135 - 145 (mEq/L)    Potassium 3.5  3.5 - 5.1 (mEq/L)    Chloride 99  96 - 112 (mEq/L)     CO2 24  19 - 32 (mEq/L)    Glucose, Bld 158 (*) 70 - 99 (mg/dL)  BUN 16  6 - 23 (mg/dL)    Creatinine, Ser 9.60  0.50 - 1.35 (mg/dL)    Calcium 8.6  8.4 - 10.5 (mg/dL)    GFR calc non Af Amer >90  >90 (mL/min)    GFR calc Af Amer >90  >90 (mL/min)   CARDIAC PANEL(CRET KIN+CKTOT+MB+TROPI)     Status: Abnormal   Collection Time   07/19/11  4:31 PM      Component Value Range Comment   Total CK 2629 (*) 7 - 232 (U/L)    CK, MB 14.2 (*) 0.3 - 4.0 (ng/mL)    Troponin I <0.30  <0.30 (ng/mL)    Relative Index 0.5  0.0 - 2.5    CARDIAC PANEL(CRET KIN+CKTOT+MB+TROPI)     Status: Abnormal (Preliminary result)   Collection Time   07/20/11  8:57 AM      Component Value Range Comment   Total CK PENDING  7 - 232 (U/L)    CK, MB 26.5 (*) 0.3 - 4.0 (ng/mL) CRITICAL VALUE NOTED.  VALUE IS CONSISTENT WITH PREVIOUSLY REPORTED AND CALLED VALUE.   Troponin I <0.30  <0.30 (ng/mL)    Relative Index PENDING  0.0 - 2.5      Radiology/Studies:  Chest 2 View  07/11/2011  *RADIOLOGY REPORT*  Clinical Data: Sleep apnea.  Hypertension.  CHEST - 2 VIEW  Comparison: None.  Findings: Cardiac and mediastinal contours appear normal.  There is potentially a calcified granuloma at the right lung apex. The lungs appear otherwise clear.  No pleural effusion is identified.  IMPRESSION:  No significant abnormality identified.  Original Report Authenticated By: Dellia Cloud, M.D.     EKG: Sinus tach rate 115, Q-waves III, aVF (change from EKG dated 06/12/11), no T-wave or ST-segment changes.   ASSESSMENT AND PLAN:  1.  Sinus tachycardia:  Pt w/ sinus tach in the setting of surgery with postoperative pain.  Pre-op HR was 77bpm and sinus tach apparently began intra-op.  ECG with q in III - repeat this am.  Though CK and MB are up, his index is NL as is his troponin, making ischemia much less likely.  We will check a 2D echo to eval LV/RV fxn.  No evidence of low-output or CHF on exam. Doubt PE (Well's criteria: score  3 (1.5 HR>100, 1.5 for recent surgery - moderate risk) but if RV fxn down would check CTA Chest.  Will also check a tsh as pt is on thyroid replacement @ home - though suspect this will be NL in light of NL resting HR pre-op.  Suspect HR is driven by pain and possibly from anxiety (on xanax @ home - resume) and/or Etoh w/d as he drinks 2-3 beers/day.  Will follow.  Ok to go to floor if troponin remains negative and echo NL.   Signed, Nicolasa Ducking, NP 07/20/2011, 10:02 AM  History reviewed with the patient, no changes to be made. The patient still has moderate back pain.  He denies any chest pain or SOB.  EKG without acute changes The patient exam reveals no crackles.  RRR, no rub.  No edema.  All available labs, radiology testing, previous records reviewed. Agree with documented assessment and plan. Agree with plans for echo.  Doubt that further cardiac evaluation will be needed other than plans described above.  Sean Miles  11:28 AM 07/07/2011

## 2011-07-20 NOTE — Progress Notes (Signed)
Patient refuses to have post op vitals monitored. Patient stated "the machine makes to much noise.  I want be able to sleep with that thing". Strader, PA notified. New orders given to change to routine vital signs.

## 2011-07-20 NOTE — Progress Notes (Signed)
OT Cancellation Note  Treatment cancelled today due to medical issues with patient which prohibited therapy:  Patient CK,MB levels trending up since yesterday, therefore, patient is not appropriate for therapy at this time.  OT to check back as time allows, most likely tomorrow, for OT eval.  Thanks,  07/20/2011 Jadyn Brasher K. Beatrice-Mitchell, MS, OTR/L Occupational Therapist Acute Rehabilitation [facility]- [facility] Phone: [phone] Pager: [phone] [email or web address]

## 2011-07-20 NOTE — Progress Notes (Signed)
CM consult for HHPT/OT/SL/DME received. Will follow up after PT/OT consults. Unclear as to why patient would need SLP  Also noted a consult for SNF for CSW. Genella Rife [facility] 07/20/2011

## 2011-07-20 NOTE — Progress Notes (Signed)
Subjective: 1 Day Post-Op Procedure(s) (LRB): ANTERIOR LATERAL LUMBAR FUSION 2 LEVELS (N/A) Patient reports pain as 5 on 0-10 scale.   Denies chest pain or SOB. Objective: Vital signs in last 24 hours: Temp:  [97 F (36.1 C)-98.2 F (36.8 C)] 98.2 F (36.8 C) (11/29 0500) Pulse Rate:  [92-124] 123  (11/29 0500) Resp:  [12-28] 24  (11/29 0500) BP: (100-160)/(59-107) 155/83 mmHg (11/29 0500) SpO2:  [93 %-100 %] 99 % (11/29 0500) Weight:  [133.8 kg (294 lb 15.6 oz)] 294 lb 15.6 oz (133.8 kg) (11/29 0500)  Intake/Output from previous day: 11/28 0701 - 11/29 0700 In: 5000 [I.V.:4500; IV Piggyback:500] Out: 1490 [Urine:1190; Blood:300] Intake/Output this shift:     Basename 07/19/11 1631  HGB 11.7*    Basename 07/19/11 1631  WBC 13.7*  RBC 3.92*  HCT 34.3*  PLT 296    Basename 07/19/11 1631  NA 136  K 3.5  CL 99  CO2 24  BUN 16  CREATININE 0.77  GLUCOSE 158*  CALCIUM 8.6   No results found for this basename: LABPT:2,INR:2 in the last 72 hours  Neurologically intact No SOB/CP No cardiac events overnight   Assessment/Plan: 1 Day Post-Op Procedure(s) (LRB): ANTERIOR LATERAL LUMBAR FUSION 2 LEVELS (N/A) Advance diet Patient admitted to 4700 for cardiac monitoring (elevated CK levels not troponin) Was not aware of this until this AM Patient only complains of moderate groin and back pain. Radicular right leg pain is improved  1. Re check cardiac enzymes and CBC 2. Remove foley 3. Contact Cardiology for evaluation  4. Mobilization with therapy 5. Possible transfer to 5000 if cleared to be off telemetry    Ulysse Siemen D 07/20/2011, 8:25 AM

## 2011-07-20 NOTE — Plan of Care (Signed)
Problem: Phase II Progression Outcomes Goal: Foley discontinued Outcome: Completed/Met Date Met:  07/20/11 D/c foley 0801

## 2011-07-21 ENCOUNTER — Encounter (HOSPITAL_COMMUNITY): Payer: Self-pay | Admitting: Orthopedic Surgery

## 2011-07-21 DIAGNOSIS — I517 Cardiomegaly: Secondary | ICD-10-CM

## 2011-07-21 DIAGNOSIS — R000 Tachycardia, unspecified: Secondary | ICD-10-CM

## 2011-07-21 LAB — CBC
HCT: 31.6 % — ABNORMAL LOW (ref 39.0–52.0)
MCH: 30 pg (ref 26.0–34.0)
MCV: 88.5 fL (ref 78.0–100.0)
Platelets: 241 10*3/uL (ref 150–400)
RDW: 12.5 % (ref 11.5–15.5)

## 2011-07-21 LAB — CARDIAC PANEL(CRET KIN+CKTOT+MB+TROPI)
CK, MB: 11.5 ng/mL (ref 0.3–4.0)
Total CK: 4261 U/L — ABNORMAL HIGH (ref 7–232)

## 2011-07-21 NOTE — Progress Notes (Signed)
Occupational Therapy Evaluation Patient Details Name: Sean Miles MRN: [id number] DOB: 20-Nov-1960 Today's Date: 07/21/2011  Problem List:  Patient Active Problem List  Diagnoses  . Sinus tachycardia  . Hypothyroidism  . Hyperlipidemia  . HTN (hypertension)  . Sleep apnea    Past Medical History:  Past Medical History  Diagnosis Date  . Complication of anesthesia     post anesthesia low sat rates  . Hypertension     followed by Dr. Hyacinth Meeker (primary)  . Pneumonia   . Sleep apnea     approx. 3 years ago, unsure of where it was done  . Hypothyroidism     takes armour thyroid  . Arthritis     psoriatic  . Hypercholesteremia   . Anxiety     takes xanax 1- 2 times per day   Past Surgical History:  Past Surgical History  Procedure Date  . Rotator cuff repair     left, august 2011  . Joint replacement     right total knee, 2006  . Hand surgery     right hand removal of growth 1991  . Tonsillectomy   . Eye surgery     lasik  . Lumbar fusion 07/19/2011    lateral lumbar fusion     OT Assessment/Plan/Recommendation OT Assessment Clinical Impression Statement: Pt. presents to OT with ALIF.  Pt. would benefit from OT services in the acute setting to increase participation in ADLs to general mod I level for safe D/C home with wife. OT Recommendation/Assessment: Patient will need skilled OT in the acute care venue OT Problem List: Pain OT Therapy Diagnosis : Acute pain OT Plan OT Frequency: Min 2X/week OT Treatment/Interventions: Self-care/ADL training;DME and/or AE instruction;Therapeutic activities;Patient/family education OT Recommendation Follow Up Recommendations: None Equipment Recommended: 3 in 1 bedside comode (pt. requests ELONGATED 3n1 please!) Individuals Consulted Consulted and Agree with Results and Recommendations: Patient OT Goals Acute Rehab OT Goals OT Goal Formulation: With patient Time For Goal Achievement: 7 days ADL Goals Pt Will Perform  Grooming: with modified independence;Standing at sink ADL Goal: Grooming - Progress: Not met Pt Will Perform Lower Body Bathing: with modified independence;with adaptive equipment;Sitting in shower ADL Goal: Lower Body Bathing - Progress: Progressing toward goals Pt Will Perform Lower Body Dressing: with modified independence;with adaptive equipment;Sitting, bed ADL Goal: Lower Body Dressing - Progress: Progressing toward goals Pt Will Transfer to Toilet: with modified independence;3-in-1;Maintaining back safety precautions (3n1 over toilet) ADL Goal: Toilet Transfer - Progress: Progressing toward goals Pt Will Perform Toileting - Clothing Manipulation: with modified independence;Standing ADL Goal: Toileting - Clothing Manipulation - Progress: Not addressed Pt Will Perform Toileting - Hygiene: with modified independence;Leaning right and/or left on 3-in-1/toilet;Other (comment) (while maintaining back precautions, AE PRN) ADL Goal: Toileting - Hygiene - Progress: Progressing toward goals  OT Evaluation Precautions/Restrictions  Precautions Precautions: Back Precaution Booklet Issued: Yes (comment) Precaution Comments: pt. able to verbalize and generalize 3/3 back precautions Required Braces or Orthoses: Yes Spinal Brace: Lumbar corset;Applied in sitting position Restrictions Weight Bearing Restrictions: No Prior Functioning Home Living Bathroom Shower/Tub: Other (comment) (with hand held shower hose)   ADL ADL Eating/Feeding: Simulated;Modified independent;Independent Where Assessed - Eating/Feeding: Edge of bed Grooming: Simulated;Set up Where Assessed - Grooming: Sitting, bed Upper Body Bathing: Simulated;Set up Where Assessed - Upper Body Bathing: Sitting, bed Lower Body Bathing: Simulated;Set up Where Assessed - Lower Body Bathing: Sitting, bed;Other (comment) (educated re: long handled sponge) Upper Body Dressing: Simulated;Set up Where Assessed - Upper Body Dressing:  Sitting, bed Lower Body Dressing: Performed;Modified independent (socks ONLY) Lower Body Dressing Details (indicate cue type and reason): pt. would benefit from demonstration and instruction with reacher for pants and underwear.  educated re: reacher, sock aid, long handled shoe horn Where Assessed - Lower Body Dressing: Sitting, bed Toilet Transfer: Simulated;Supervision/safety Toilet Transfer Method: Stand pivot Toileting - Clothing Manipulation: Simulated;Supervision/safety Toileting - Hygiene: Not assessed;Other (comment) (educated re: toileting tongs/modified technique) Tub/Shower Transfer: Not assessed Tub/Shower Transfer Method: Not assessed ADL Comments: pt. moving great.  At setup/supervision level with most ADLs today. Vision/Perception    Cognition Cognition Arousal/Alertness: Awake/alert Overall Cognitive Status: Appears within functional limits for tasks assessed Sensation/Coordination   Extremity Assessment RUE Assessment RUE Assessment:  (grossly assessed, appear WFL) LUE Assessment LUE Assessment: Not tested (grossly assessed, appear WFL) Mobility  Bed Mobility Bed Mobility: Yes Rolling Left: 5: Supervision Rolling Left Details (indicate cue type and reason): Verbal reminders to bend knees and keep back precautions with roll. Left Sidelying to Sit: 5: Supervision Left Sidelying to Sit Details (indicate cue type and reason): Verbal cues to maintain back precautions, UE placement. Sitting - Scoot to Edge of Bed: 5: Supervision Sitting - Scoot to [place] of Bed Details (indicate cue type and reason): Supervision for pt safety Transfers Transfers: Yes Sit to Stand: 5: Supervision;Without upper extremity assist;From bed Sit to Stand Details (indicate cue type and reason): Supervision for pt safety. Stand to Sit: 5: Supervision;Without upper extremity assist;To bed Stand to Sit Details: pt. self corrected for hand placement Exercises   End of Session OT - End of  Session Equipment Utilized During Treatment: Gait belt;Back brace Activity Tolerance: Patient tolerated treatment well Patient left: in bed General Behavior During Session: [facility] for tasks performed Cognition: [facility] for tasks performed   Dovie Kapusta Kirsten Beatrice-Mitchell 07/21/2011, 2:36 PM  07/21/2011 Bracha Frankowski K. Beatrice-Mitchell, MS, OTR/L Occupational Therapist Acute Rehabilitation [facility] Phone: [phone] Pager: [phone] [email or web address]

## 2011-07-21 NOTE — Progress Notes (Signed)
Physical Therapy Treatment Patient Details Name: Sean Miles MRN: [id number] DOB: 1961/06/05 Today's Date: 07/21/2011  PT Assessment/Plan  PT - Assessment/Plan Comments on Treatment Session: Pt tolerated treatment well.  Able to increase ambulation distance.  Pt reported a 4/10 pain.  He was able to verbalize 2/3 of his back precautions and with prompting remembered the 3rd.   PT Plan: Frequency remains appropriate;Discharge plan needs to be updated PT Frequency: Min 5X/week Follow Up Recommendations: 24 hour supervision/assistance Equipment Recommended: 3 in 1 bedside comode PT Goals  Acute Rehab PT Goals PT Goal: Rolling Supine to Left Side - Progress: Progressing toward goal PT Goal: Supine/Side to Sit - Progress: Progressing toward goal PT Goal: Sit to Supine/Side - Progress: Other (comment) (not assessed) PT Transfer Goal: Sit to Stand/Stand to Sit - Progress: Progressing toward goal PT Transfer Goal: Bed to Chair/Chair to Bed - Progress: Other (comment) (not assessed) PT Goal: Ambulate - Progress: Progressing toward goal PT Goal: Up/Down Stairs - Progress: Not met Additional Goals PT Goal: Additional Goal #1 - Progress: Progressing toward goal  PT Treatment Precautions/Restrictions  Precautions Precautions: Back Precaution Booklet Issued: Yes (comment) Precaution Comments: Pt was able to name 3/3 of his post-op back precautions Required Braces or Orthoses: Yes Spinal Brace: Lumbar corset;Applied in sitting position Restrictions Weight Bearing Restrictions: No Mobility (including Balance) Bed Mobility Bed Mobility: Yes Rolling Left: 5: Supervision Rolling Left Details (indicate cue type and reason): Verbal reminders to bend knees and keep back precautions with roll. Left Sidelying to Sit: 5: Supervision Left Sidelying to Sit Details (indicate cue type and reason): Verbal cues to maintain back precautions, UE placement. Sitting - Scoot to Edge of Bed: 5:  Supervision Sitting - Scoot to [place] of Bed Details (indicate cue type and reason): Supervision for pt safety Transfers Transfers: Yes Sit to Stand: 5: Supervision;With upper extremity assist;From bed Sit to Stand Details (indicate cue type and reason): Supervision for pt safety. Stand to Sit: 5: Supervision;With upper extremity assist;With armrests;To chair/3-in-1 Stand to Sit Details: Supervision for pt safety.  Verbal cues for hand placement and to maintain straight back with sitting. Ambulation/Gait Ambulation/Gait: Yes Ambulation/Gait Assistance: 4: Min assist Ambulation/Gait Assistance Details (indicate cue type and reason): Mingaurd A for pt safety.  A moving IV pole.   Ambulation Distance (Feet): 200 Feet Assistive device: None Gait Pattern: Decreased stride length (Very stiff appearance) Gait velocity: decreased Stairs: No Wheelchair Mobility Wheelchair Mobility: No  Posture/Postural Control Posture/Postural Control: No significant limitations Balance Balance Assessed: Yes Static Standing Balance Static Standing - Balance Support: During functional activity;No upper extremity supported (While using the bathroom and washing his hands) Static Standing - Level of Assistance: 5: Stand by assistance Static Standing - Comment/# of Minutes: Pt stood approximately 5 min while he used the bathroom and then washed his hands at the sink. Exercise    End of Session PT - End of Session Equipment Utilized During Treatment: Gait belt;Back brace Activity Tolerance: Patient tolerated treatment well Patient left: in chair;with call bell in reach Nurse Communication: Mobility status for transfers;Mobility status for ambulation General Behavior During Session: [facility] for tasks performed Cognition: [facility] for tasks performed  Lacinda Axon 07/21/2011, 12:28 PM

## 2011-07-21 NOTE — Progress Notes (Signed)
Subjective: 2 Days Post-Op Procedure(s) (LRB): ANTERIOR LATERAL LUMBAR FUSION 2 LEVELS (N/A) Patient reports pain as 5 on 0-10 scale.   Pain control improved Ambulated short distance in room yesterday   Objective: Vital signs in last 24 hours: Temp:  [97.9 F (36.6 C)-98.8 F (37.1 C)] 97.9 F (36.6 C) (11/30 0456) Pulse Rate:  [94-115] 94  (11/30 0456) Resp:  [18-20] 20  (11/30 0456) BP: (157-164)/(78-79) 164/79 mmHg (11/30 0456) SpO2:  [97 %-98 %] 97 % (11/30 0456) Weight:  [128.368 kg (283 lb)] 283 lb (128.368 kg) (11/30 0456)  Intake/Output from previous day: 11/29 0701 - 11/30 0700 In: 940 [P.O.:840; IV Piggyback:100] Out: 6900 [Urine:6900] Intake/Output this shift:     Basename 07/21/11 0050 07/19/11 1631  HGB 10.7* 11.7*    Basename 07/21/11 0050 07/19/11 1631  WBC 11.8* 13.7*  RBC 3.57* 3.92*  HCT 31.6* 34.3*  PLT 241 296    Basename 07/19/11 1631  NA 136  K 3.5  CL 99  CO2 24  BUN 16  CREATININE 0.77  GLUCOSE 158*  CALCIUM 8.6   No results found for this basename: LABPT:2,INR:2 in the last 72 hours Wound C/D/I No BM  + flatus.  Abd. Soft/NT No SOB/CP 2+ DP/PT pulses Neurologically intact Xrays: improved sagittal and coronal alignment hardware satisfactory  Assessment/Plan: 2 Days Post-Op Procedure(s) (LRB): ANTERIOR LATERAL LUMBAR FUSION 2 LEVELS (N/A) Advance diet Await clearance for transfer to Ortho floor Trop. Have all been negative for cardiac event Progress with therapy today Possible D/C to home Sunday  Kindsey Eblin D 07/21/2011, 8:13 AM

## 2011-07-21 NOTE — Progress Notes (Signed)
CSW received consult for SNF. Currently, PT/OT is recommending home health PT/OT. CSW was unable to meet with pt due to pt having an echo. CSW will attempt to meet with pt to assess discharge plan. CSW will also follow up with RNCM.   Dede Query, MSW, Theresia Majors [phone]

## 2011-07-21 NOTE — Progress Notes (Signed)
Patient ID: Sean Miles, male   DOB: [birth year], 50 y.o.   MRN: [id number] SUBJECTIVE:  No cardiac symptoms.   Filed Vitals:   07/20/11 1020 07/20/11 1420 07/20/11 2204 07/21/11 0456  BP:  160/78 157/78 164/79  Pulse: 115 112 104 94  Temp:  98.1 F (36.7 C) 98.8 F (37.1 C) 97.9 F (36.6 C)  TempSrc:      Resp:  20 18 20   Height:      Weight:    283 lb (128.368 kg)  SpO2:  98% 98% 97%    Intake/Output Summary (Last 24 hours) at 07/21/11 0936 Last data filed at 07/21/11 1191  Gross per 24 hour  Intake    600 ml  Output   4250 ml  Net  -3650 ml    LABS: Basic Metabolic Panel:  Basename 07/19/11 1631  NA 136  K 3.5  CL 99  CO2 24  GLUCOSE 158*  BUN 16  CREATININE 0.77  CALCIUM 8.6  MG --  PHOS --   Liver Function Tests: No results found for this basename: AST:2,ALT:2,ALKPHOS:2,BILITOT:2,PROT:2,ALBUMIN:2 in the last 72 hours No results found for this basename: LIPASE:2,AMYLASE:2 in the last 72 hours CBC:  Basename 07/21/11 0050 07/19/11 1631  WBC 11.8* 13.7*  NEUTROABS -- --  HGB 10.7* 11.7*  HCT 31.6* 34.3*  MCV 88.5 87.5  PLT 241 296   Cardiac Enzymes:  Basename 07/21/11 0050 07/20/11 1639 07/20/11 0857  CKTOTAL 4261* 4446* 4916*  CKMB 11.5* 17.1* 26.5*  CKMBINDEX -- -- --  TROPONINI <0.30 <0.30 <0.30   BNP: No results found for this basename: POCBNP:3 in the last 72 hours D-Dimer: No results found for this basename: DDIMER:2 in the last 72 hours Hemoglobin A1C: No results found for this basename: HGBA1C in the last 72 hours Fasting Lipid Panel: No results found for this basename: CHOL,HDL,LDLCALC,TRIG,CHOLHDL,LDLDIRECT in the last 72 hours Thyroid Function Tests:  Basename 07/20/11 1617  TSH 0.040*  T4TOTAL --  T3FREE --  THYROIDAB --    RADIOLOGY:  Chest 2 View  07/11/2011  *RADIOLOGY REPORT*  Clinical Data: Sleep apnea.  Hypertension.  CHEST - 2 VIEW  Comparison: None.  Findings: Cardiac and mediastinal contours appear normal.   There is potentially a calcified granuloma at the right lung apex. The lungs appear otherwise clear.  No pleural effusion is identified.  IMPRESSION:  No significant abnormality identified.  Original Report Authenticated By: Dellia Cloud, M.D.    PHYSICAL EXAM  Patient is stable and feeling well. There is no jugulovenous distention. Lungs are clear. Respiratory effort is nonlabored. Cardiac exam reveals S1-S2. There are no clicks or significant murmurs. The abdomen is soft. There is no peripheral edema.   TELEMETRY:   Telemetry reveals sinus rhythm and mild sinus tachycardia.   ASSESSMENT AND PLAN:  Active Problems:  Sinus tachycardia    Patient's heart rate is slowing. He still has mild sinus tachycardia. This is related to his pain and postoperative state. Troponins are normal. He can be transferred to the orthopedic floor. His echo has not yet been done. It can be done from the orthopedic floor.   Hypothyroidism  Hyperlipidemia  HTN (hypertension)  Sleep apnea   Willa Rough 07/21/2011 9:36 AM

## 2011-07-21 NOTE — Progress Notes (Signed)
  Echocardiogram 2D Echocardiogram has been performed.  Brighton Pilley, Real Cons 07/21/2011, 2:52 PM

## 2011-07-22 LAB — CBC
HCT: 33.9 % — ABNORMAL LOW (ref 39.0–52.0)
MCH: 29.5 pg (ref 26.0–34.0)
MCHC: 33.6 g/dL (ref 30.0–36.0)
RDW: 12.4 % (ref 11.5–15.5)

## 2011-07-22 MED ORDER — BISACODYL 10 MG RE SUPP
10.0000 mg | Freq: Once | RECTAL | Status: AC
  Administered 2011-07-22: 10 mg via RECTAL

## 2011-07-22 NOTE — Progress Notes (Signed)
Patient ID: Sean Miles, male   DOB: May 23, 1961, 50 y.o.   MRN: [id number] SUBJECTIVE:    Patient is doing well.  His heart rate has slowed down appropriately.  He had a two-dimensional echo done yesterday which is normal.  Ejection fraction is 65%.   Filed Vitals:   07/21/11 1140 07/21/11 1500 07/21/11 2250 07/22/11 0545  BP:  163/77 151/82 119/66  Pulse: 96 78 93 75  Temp:  97.7 F (36.5 C) 97.9 F (36.6 C) 98.1 F (36.7 C)  TempSrc:    Oral  Resp:  20 16 18   Height:      Weight:      SpO2: 96% 97% 97% 97%    Intake/Output Summary (Last 24 hours) at 07/22/11 1022 Last data filed at 07/21/11 2200  Gross per 24 hour  Intake    960 ml  Output      0 ml  Net    960 ml    LABS: Basic Metabolic Panel:  Basename 07/19/11 1631  NA 136  K 3.5  CL 99  CO2 24  GLUCOSE 158*  BUN 16  CREATININE 0.77  CALCIUM 8.6  MG --  PHOS --   Liver Function Tests: No results found for this basename: AST:2,ALT:2,ALKPHOS:2,BILITOT:2,PROT:2,ALBUMIN:2 in the last 72 hours No results found for this basename: LIPASE:2,AMYLASE:2 in the last 72 hours CBC:  Basename 07/22/11 0500 07/21/11 0050  WBC 11.2* 11.8*  NEUTROABS -- --  HGB 11.4* 10.7*  HCT 33.9* 31.6*  MCV 87.6 88.5  PLT 268 241   Cardiac Enzymes:  Basename 07/21/11 0050 07/20/11 1639 07/20/11 0857  CKTOTAL 4261* 4446* 4916*  CKMB 11.5* 17.1* 26.5*  CKMBINDEX -- -- --  TROPONINI <0.30 <0.30 <0.30   BNP: No results found for this basename: POCBNP:3 in the last 72 hours D-Dimer: No results found for this basename: DDIMER:2 in the last 72 hours Hemoglobin A1C: No results found for this basename: HGBA1C in the last 72 hours Fasting Lipid Panel: No results found for this basename: CHOL,HDL,LDLCALC,TRIG,CHOLHDL,LDLDIRECT in the last 72 hours Thyroid Function Tests:  Basename 07/20/11 1617  TSH 0.040*  T4TOTAL --  T3FREE --  THYROIDAB --      PHYSICAL EXAM   Cardiac exam reveals an S1-S2.  There are no clicks or  significant murmurs.   ASSESSMENT AND PLAN:  Active Problems:   Sinus tachycardia       Sinus tachycardia is resolved.  No further cardiac workup is needed.  Hypothyroidism  Hyperlipidemia  HTN (hypertension)  Sleep apnea   Willa Rough 07/22/2011 10:22 AM

## 2011-07-22 NOTE — Progress Notes (Signed)
Subjective: 3 Days Post-Op Procedure(s) (LRB): ANTERIOR LATERAL LUMBAR FUSION 2 LEVELS (N/A) Patient reports pain as mild.   comfortable   Sean Miles is an 50 y.o. male.    Past Medical History  Diagnosis Date  . Complication of anesthesia     post anesthesia low sat rates  . Hypertension     followed by Dr. Hyacinth Meeker (primary)  . Pneumonia   . Sleep apnea     approx. 3 years ago, unsure of where it was done  . Hypothyroidism     takes armour thyroid  . Arthritis     psoriatic  . Hypercholesteremia   . Anxiety     takes xanax 1- 2 times per day    Past Surgical History  Procedure Date  . Rotator cuff repair     left, august 2011  . Joint replacement     right total knee, 2006  . Hand surgery     right hand removal of growth 1991  . Tonsillectomy   . Eye surgery     lasik  . Lumbar fusion 07/19/2011    lateral lumbar fusion   . Anterior lat lumbar fusion 07/19/2011    Procedure: ANTERIOR LATERAL LUMBAR FUSION 2 LEVELS;  Surgeon: Alvy Beal;  Location: [facility] OR;  Service: Orthopedics;  Laterality: N/A;  Xlif and Posterior Spinal Fusion interbody L2-L4    Family History  Problem Relation Age of Onset  . Hypertension Father   . Diabetes Father   . Stroke Father   . COPD Mother   . Heart failure Mother   . Coronary artery disease Brother     Social History:  reports that he quit smoking about 26 years ago. His smoking use included Cigarettes. He has a 2.5 pack-year smoking history. He does not have any smokeless tobacco history on file. He reports that he drinks alcohol. He reports that he does not use illicit drugs.  Allergies: No Known Allergies    Results for orders placed during the hospital encounter of 07/19/11 (from the past 48 hour(s))  TSH     Status: Abnormal   Collection Time   07/20/11  4:17 PM      Component Value Range Comment   TSH 0.040 (*) 0.350 - 4.500 (uIU/mL)   CARDIAC PANEL(CRET KIN+CKTOT+MB+TROPI)     Status: Abnormal   Collection  Time   07/20/11  4:39 PM      Component Value Range Comment   Total CK 4446 (*) 7 - 232 (U/L)    CK, MB 17.1 (*) 0.3 - 4.0 (ng/mL) CRITICAL VALUE NOTED.  VALUE IS CONSISTENT WITH PREVIOUSLY REPORTED AND CALLED VALUE.   Troponin I <0.30  <0.30 (ng/mL)    Relative Index 0.4  0.0 - 2.5    CARDIAC PANEL(CRET KIN+CKTOT+MB+TROPI)     Status: Abnormal   Collection Time   07/21/11 12:50 AM      Component Value Range Comment   Total CK 4261 (*) 7 - 232 (U/L)    CK, MB 11.5 (*) 0.3 - 4.0 (ng/mL) CRITICAL VALUE NOTED.  VALUE IS CONSISTENT WITH PREVIOUSLY REPORTED AND CALLED VALUE.   Troponin I <0.30  <0.30 (ng/mL)    Relative Index 0.3  0.0 - 2.5    CBC     Status: Abnormal   Collection Time   07/21/11 12:50 AM      Component Value Range Comment   WBC 11.8 (*) 4.0 - 10.5 (K/uL)    RBC 3.57 (*) 4.22 -  5.81 (MIL/uL)    Hemoglobin 10.7 (*) 13.0 - 17.0 (g/dL)    HCT 11.9 (*) 14.7 - 52.0 (%)    MCV 88.5  78.0 - 100.0 (fL)    MCH 30.0  26.0 - 34.0 (pg)    MCHC 33.9  30.0 - 36.0 (g/dL)    RDW 82.9  56.2 - 13.0 (%)    Platelets 241  150 - 400 (K/uL)   CBC     Status: Abnormal   Collection Time   07/22/11  5:00 AM      Component Value Range Comment   WBC 11.2 (*) 4.0 - 10.5 (K/uL)    RBC 3.87 (*) 4.22 - 5.81 (MIL/uL)    Hemoglobin 11.4 (*) 13.0 - 17.0 (g/dL)    HCT 86.5 (*) 78.4 - 52.0 (%)    MCV 87.6  78.0 - 100.0 (fL)    MCH 29.5  26.0 - 34.0 (pg)    MCHC 33.6  30.0 - 36.0 (g/dL)    RDW 69.6  29.5 - 28.4 (%)    Platelets 268  150 - 400 (K/uL)     No results found.  @ROS @ Blood pressure 119/66, pulse 75, temperature 98.1 F (36.7 C), temperature source Oral, resp. rate 18, height 5\' 10"  (1.778 m), weight 128.368 kg (283 lb), SpO2 97.00%. @PHYSEXAMBYAGE2 @  Assessment/Plan:   Shaliah Wann A 07/22/2011, 9:16 AM       Objective: Vital signs in last 24 hours: Temp:  [97.7 F (36.5 C)-98.1 F (36.7 C)] 98.1 F (36.7 C) (12/01 0545) Pulse Rate:  [75-96] 75  (12/01 0545) Resp:   [16-20] 18  (12/01 0545) BP: (119-163)/(66-82) 119/66 mmHg (12/01 0545) SpO2:  [96 %-97 %] 97 % (12/01 0545)  Intake/Output from previous day: 11/30 0701 - 12/01 0700 In: 1200 [P.O.:1200] Out: -  Intake/Output this shift:     Basename 07/22/11 0500 07/21/11 0050 07/19/11 1631  HGB 11.4* 10.7* 11.7*    Basename 07/22/11 0500 07/21/11 0050  WBC 11.2* 11.8*  RBC 3.87* 3.57*  HCT 33.9* 31.6*  PLT 268 241    Basename 07/19/11 1631  NA 136  K 3.5  CL 99  CO2 24  BUN 16  CREATININE 0.77  GLUCOSE 158*  CALCIUM 8.6   No results found for this basename: LABPT:2,INR:2 in the last 72 hours Wound clean, dry, intact with no erythema. No change neurovascular status  Neurologically intact  Assessment/Plan: 3 Days Post-Op Procedure(s) (LRB): ANTERIOR LATERAL LUMBAR FUSION 2 LEVELS (N/A) Discharge home with home health tomorrow likely Continue PT and OT   Lew Prout A 07/22/2011, 8:46 AM

## 2011-07-22 NOTE — Progress Notes (Addendum)
Physical Therapy Treatment/Discharge Patient Details Name: MAXIMILIEN HAYASHI MRN: [id number] DOB: Jan 23, 1961 Today's Date: 07/22/2011  PT Assessment/Plan  PT - Assessment/Plan Comments on Treatment Session: Pt. tolerated tx session well.  No further PT needs identified.  Pt. reports independent with bed mobility. PT Plan: All goals met and education completed, patient dischaged from PT services PT Goals  Acute Rehab PT Goals PT Goal: Rolling Supine to Left Side - Progress: Other (comment) (pt. reports being independent) PT Goal: Supine/Side to Sit - Progress: Other (comment) (pt. reports being independent) PT Goal: Sit to Supine/Side - Progress: Other (comment) (pt. reports being independent) PT Transfer Goal: Bed to Chair/Chair to Bed - Progress: Met PT Goal: Ambulate - Progress: Met PT Goal: Up/Down Stairs - Progress: Met Additional Goals PT Goal: Additional Goal #1 - Progress: Met  PT Treatment Precautions/Restrictions  Precautions Precautions: Back Precaution Booklet Issued: Yes (comment) Precaution Comments: pt. verbalized 3/3 precautions and maintained throughout Required Braces or Orthoses: Yes Spinal Brace: Lumbar corset;Applied in sitting position Restrictions Weight Bearing Restrictions: No Mobility (including Balance) Bed Mobility Bed Mobility: No Transfers Transfers: Yes Sit to Stand: 6: Modified independent (Device/Increase time);From chair/3-in-1;With upper extremity assist Stand to Sit: 6: Modified independent (Device/Increase time);With upper extremity assist;To chair/3-in-1 Ambulation/Gait Ambulation/Gait: Yes Ambulation/Gait Assistance: 6: Modified independent (Device/Increase time) Ambulation Distance (Feet): 300 Feet Assistive device: None;Other (Comment) (LSO) Gait Pattern: Within Functional Limits Stairs: Yes Stairs Assistance: 6: Modified independent (Device/Increase time) Stair Management Technique: One rail Right;Step to pattern;Forwards Number of  Stairs: 10  Wheelchair Mobility Wheelchair Mobility: No    Exercise    End of Session PT - End of Session Equipment Utilized During Treatment: Gait belt;Back brace Activity Tolerance: Patient tolerated treatment well Patient left: in chair;with call bell in reach Nurse Communication: Mobility status for ambulation General Behavior During Session: [facility] for tasks performed Cognition: [facility] for tasks performed  Feltis, Nicki Reaper 07/22/2011, 10:49 AM Nicki Reaper. Feltis, PT, DPT [phone]

## 2011-07-22 NOTE — Progress Notes (Signed)
Occupational Therapy Treatment Patient Details Name: JERAMIE SCOGIN MRN: [id number] DOB: 1961-01-18 Today's Date: 07/22/2011  OT Assessment/Plan OT Assessment/Plan OT Frequency: Min 2X/week Follow Up Recommendations: None Equipment Recommended: 3 in 1 bedside comode (elongated seat) OT Goals Acute Rehab OT Goals Time For Goal Achievement: 7 days ADL Goals ADL Goal: Grooming - Progress: Not addressed ADL Goal: Lower Body Bathing - Progress: Progressing toward goals ADL Goal: Lower Body Dressing - Progress: Progressing toward goals ADL Goal: Toilet Transfer - Progress: Progressing toward goals ADL Goal: Toileting - Clothing Manipulation - Progress: Progressing toward goals ADL Goal: Toileting - Hygiene - Progress: Progressing toward goals  OT Treatment Precautions/Restrictions  Precautions Precautions: Back Precaution Comments: pt. verbalized 3/3 precautions and maintained throughout Required Braces or Orthoses: Yes Spinal Brace: Lumbar corset Restrictions Weight Bearing Restrictions: No   ADL ADL Lower Body Dressing: Simulated Lower Body Dressing Details (indicate cue type and reason): pt. states he does not need a reacher because he can safely reach his feet by crossing L/R legs over each other Toilet Transfer: Performed;Modified independent Toilet Transfer Details (indicate cue type and reason): discussed what is safe and not safe to hold onto while sitting down on the toilet. reviewed not using door handle or back of the toilet, as pt. was planning on doing Toilet Transfer Method: Proofreader:  (discussed need for 3-n-1 at home) Toileting - Hygiene: Other (comment) (reviewed toilet hygiene tech.) ADL Comments: doing well, plans for d/c set for tomorrow. has no questions or concerns for any ADLS Mobility  Bed Mobility Bed Mobility: No Transfers Transfers: Yes Sit to Stand: 6: Modified independent (Device/Increase time) Stand to Sit: 6: Modified  independent (Device/Increase time);With upper extremity assist Exercises    End of Session OT - End of Session Equipment Utilized During Treatment: Back brace Activity Tolerance: Patient tolerated treatment well Patient left: in chair;with call bell in reach General Behavior During Session: [facility] for tasks performed Cognition: [facility] for tasks performed  Robet Leu COTA/L 07/22/2011, 12:56 PM

## 2011-07-23 LAB — CBC
MCH: 29.6 pg (ref 26.0–34.0)
MCV: 86.4 fL (ref 78.0–100.0)
Platelets: 298 10*3/uL (ref 150–400)
RBC: 3.89 MIL/uL — ABNORMAL LOW (ref 4.22–5.81)
RDW: 12.1 % (ref 11.5–15.5)
WBC: 11.6 10*3/uL — ABNORMAL HIGH (ref 4.0–10.5)

## 2011-07-23 MED ORDER — METHOCARBAMOL 500 MG PO TABS: 500.0000 mg | ORAL_TABLET | Freq: Three times a day (TID) | ORAL | Status: AC

## 2011-07-23 MED ORDER — OXYCODONE-ACETAMINOPHEN 10-325 MG PO TABS: 1.0000 | ORAL_TABLET | ORAL | Status: AC | PRN

## 2011-07-23 MED ORDER — POLYETHYLENE GLYCOL 3350 17 G PO PACK: 17.0000 g | PACK | Freq: Every day | ORAL | Status: AC

## 2011-07-23 NOTE — Progress Notes (Signed)
Case Management:   07/23/11 1000 Spoke with pt. about [facility] services as well as giving pt. list of agencies and to inquire of DME 3-N-1.  Pt. chose [facility] for [facility] and declined 3-N-1 for now.  TC to [place], with [facility] to make referral for [facility] PT/OT SOC to begin 24-48hr.  Order placed in [facility].

## 2011-07-23 NOTE — Progress Notes (Signed)
Subjective: Procedure(s) (LRB): ANTERIOR LATERAL LUMBAR FUSION 2 LEVELS (N/A) 4 Days Post-Op  Patient reports pain as 3 on 0-10 scale.  Reports none leg pain reports incisional back pain   Positive void Positive bowel movement Positive flatus Negative chest pain or shortness of breath  Objective: Vital signs in last 24 hours: Temp:  [98.3 F (36.8 C)-98.8 F (37.1 C)] 98.4 F (36.9 C) (12/02 0526) Pulse Rate:  [69-76] 69  (12/02 0526) Resp:  [18] 18  (12/02 0526) BP: (122-154)/(68-89) 122/68 mmHg (12/02 0526) SpO2:  [97 %-98 %] 98 % (12/02 0526)  Intake/Output from previous day: 12/01 0701 - 12/02 0700 In: 660 [P.O.:660] Out: -    Basename 07/23/11 0600 07/22/11 0500  WBC 11.6* 11.2*  RBC 3.89* 3.87*  HCT 33.6* 33.9*  PLT 298 268   No results found for this basename: NA:2,K:2,CL:2,CO2:2,BUN:2,CREATININE:2,GLUCOSE:2,CALCIUM:2 in the last 72 hours No results found for this basename: LABPT:2,INR:2 in the last 72 hours  ABD soft Neurovascular intact Sensation intact distally Intact pulses distally Incision: dressing C/D/I Compartment soft  Assessment/Plan: Patient stable Cardiology issues resolved  Discharge home with home health today Follow up with Dr. Shon Baton 2 weeks  Gwinda Maine 07/23/2011, 10:31 AM

## 2011-07-23 NOTE — Discharge Summary (Signed)
Patient ID: Sean Miles MRN: [id number] DOB/AGE: [id number] 52 y.o.  Admit date: 07/19/2011 Discharge date: 07/23/2011  Admission Diagnoses:  Active Problems:  Sinus tachycardia  Hypothyroidism  Hyperlipidemia  HTN (hypertension)  Sleep apnea   Discharge Diagnoses:  Same  Past Medical History  Diagnosis Date  . Complication of anesthesia     post anesthesia low sat rates  . Hypertension     followed by Dr. Hyacinth Meeker (primary)  . Pneumonia   . Sleep apnea     approx. 3 years ago, unsure of where it was done  . Hypothyroidism     takes armour thyroid  . Arthritis     psoriatic  . Hypercholesteremia   . Anxiety     takes xanax 1- 2 times per day    Surgeries: Procedure(s): ANTERIOR LATERAL LUMBAR FUSION 2 LEVELS on 07/19/2011   Consultants:    Discharged Condition: Improved  Hospital Course: Sean Miles is an 50 y.o. male who was admitted 07/19/2011 for operative treatment of<principal problem not specified>. Patient has severe unremitting pain that affects sleep, daily activities, and work/hobbies. After pre-op clearance the patient was taken to the operating room on 07/19/2011 and underwent  Procedure(s): ANTERIOR LATERAL LUMBAR FUSION 2 LEVELS.    Patient was given perioperative antibiotics: Anti-infectives     Start     Dose/Rate Route Frequency Ordered Stop   07/19/11 2200   ceFAZolin (ANCEF) IVPB 1 g/50 mL premix        1 g 100 mL/hr over 30 Minutes Intravenous 3 times per day 07/19/11 1920 07/20/11 1600   07/18/11 1515   ceFAZolin (ANCEF) IVPB 2 g/50 mL premix        2 g 100 mL/hr over 30 Minutes Intravenous 60 min pre-op 07/18/11 1503 07/19/11 0845           Patient was given sequential compression devices and early ambulation to prevent DVT.  It should be noted that postoperatively the patient was found to have sinus tachycardia.  He was evaluated by cardiology.  Please see the dictated note for the specifics.  Telemetry reveals sinus rhythm  and mild sinus tachycardia.  Laboratory data revealed negative troponin and his echo was normal with an EF of 65%.  He was transferred to the ortho floor and care was continued without other complications.  The patient benefited maximally from hospital stay.  Recent vital signs: Patient Vitals for the past 24 hrs:  BP Temp Temp src Pulse Resp SpO2  07/23/11 0526 122/68 mmHg 98.4 F (36.9 C) Oral 69  18  98 %  2011/08/21 2026 154/89 mmHg 98.3 F (36.8 C) Oral 75  18  98 %  August 21, 2011 1400 137/83 mmHg 98.8 F (37.1 C) Oral 76  18  97 %     Recent laboratory studies:  Basename 07/23/11 0600 2011-08-21 0500  WBC 11.6* 11.2*  HGB 11.5* 11.4*  HCT 33.6* 33.9*  PLT 298 268  NA -- --  K -- --  CL -- --  CO2 -- --  BUN -- --  CREATININE -- --  GLUCOSE -- --  INR -- --  CALCIUM -- --     Discharge Medications:  Current Discharge Medication List    START taking these medications   Details  methocarbamol (ROBAXIN) 500 MG tablet Take 1 tablet (500 mg total) by mouth 3 (three) times daily. Qty: 90 tablet, Refills: 0    oxyCODONE-acetaminophen (PERCOCET) 10-325 MG per tablet Take 1 tablet by mouth every 4 (  four) hours as needed for pain. Qty: 120 tablet, Refills: 0    polyethylene glycol (MIRALAX / GLYCOLAX) packet Take 17 g by mouth daily. Qty: 14 each, Refills: 0      CONTINUE these medications which have NOT CHANGED   Details  ALPRAZolam (XANAX) 0.5 MG tablet Take 0.5 mg by mouth at bedtime as needed.      amLODipine (NORVASC) 5 MG tablet Take 5 mg by mouth daily.      benazepril-hydrochlorthiazide (LOTENSIN HCT) 20-12.5 MG per tablet Take 1 tablet by mouth 2 (two) times daily.     co-enzyme Q-10 30 MG capsule Take 1 capsule by mouth daily.      fenofibrate micronized (LOFIBRA) 200 MG capsule Take 200 mg by mouth daily.      folic acid (FOLVITE) 1 MG tablet Take 1 mg by mouth daily.      Glucosamine HCl 1000 MG TABS Take 1,000 mg by mouth daily.      methotrexate  (RHEUMATREX) 2.5 MG tablet Take 2.5 mg by mouth once a week. Caution:Chemotherapy. Protect from light. Takes 6 tablets once a week on Sundays     pravastatin (PRAVACHOL) 80 MG tablet Take 80 mg by mouth daily.      thyroid (ARMOUR) 32.5 MG tablet Take 32.5 mg by mouth daily.      Multiple Vitamins-Minerals (MULTIVITAMINS THER. W/MINERALS) TABS Take 1 tablet by mouth daily.      omega-3 acid ethyl esters (LOVAZA) 1 G capsule Take 2 g by mouth 2 (two) times daily. Patient has stopped due to procedure and upcoming labwork       STOP taking these medications     HYDROcodone-acetaminophen (NORCO) 5-325 MG per tablet      diclofenac (VOLTAREN) 75 MG EC tablet         Diagnostic Studies:  Chest 2 View  07/11/2011  *RADIOLOGY REPORT*  Clinical Data: Sleep apnea.  Hypertension.  CHEST - 2 VIEW  Comparison: None.  Findings: Cardiac and mediastinal contours appear normal.  There is potentially a calcified granuloma at the right lung apex. The lungs appear otherwise clear.  No pleural effusion is identified.  IMPRESSION:  No significant abnormality identified.  Original Report Authenticated By: Dellia Cloud, M.D.   Dg Lumbar Spine 2-3 Views  07/20/2011  *RADIOLOGY REPORT*  Clinical Data: Postop radiograph, status post lumbar fusion.  LUMBAR SPINE - 2-3 VIEW  Comparison: Intraoperative radiographs performed 07/19/2011  Findings: The patient is status post left-sided unilateral lumbar spinal fusion at L2-L4; spinal fusion hardware is grossly unremarkable in appearance.  Visualized screws are noted overlying the pedicles.  There is no evidence of fracture or subluxation. There is chronic disc space narrowing at L1-L2.  Facet disease is noted at the lower lumbar spine.  The visualized bowel gas pattern is unremarkable.  IMPRESSION: Status post unilateral lumbar spinal fusion at L2-L4; spinal fusion hardware grossly unremarkable in appearance.  No evidence of fracture or subluxation.  Original  Report Authenticated By: Tonia Ghent, M.D.   Dg Lumbar Spine 2-3 Views  07/19/2011  *RADIOLOGY REPORT*  Clinical Data: Fusion L2-L4.  LUMBAR SPINE - 2-3 VIEW  Comparison: 07/11/2011  Findings: Unilateral posterior fusion noted from L2-L4 on the left. No hardware bony complicating feature.  IMPRESSION: L2-L4 fusion.  Original Report Authenticated By: Cyndie Chime, M.D.   Dg Lumbar Spine 2-3 Views  07/11/2011  *RADIOLOGY REPORT*  Clinical Data: Preoperative exam.  LUMBAR SPINE - 2-3 VIEW  Comparison: 05/15/2011 MR.  Findings:  Films labeled as per prior MR.  Levoscoliosis with disc space narrowing most notable on the right at the L2-3 level.  Mild kyphosis L1-2 level.  IMPRESSION: Films labeled as per prior MR.  Levoscoliosis with disc space narrowing most notable on the right at the L2-3 level.  Original Report Authenticated By: Fuller Canada, M.D.   Dg C-arm Gt 120 Min  07/19/2011  CLINICAL DATA: XLIF L2-4   C-ARM GT 120 MIN  Fluoroscopy was utilized by the requesting physician.  No radiographic  interpretation.      Discharge Orders    Future Orders Please Complete By Expires   Diet - low sodium heart healthy      Call MD / Call 911      Comments:   If you experience chest pain or shortness of breath, CALL 911 and be transported to the hospital emergency room.  If you develope a fever above 101 F, pus (white drainage) or increased drainage or redness at the wound, or calf pain, call your surgeon's office.   Constipation Prevention      Comments:   Drink plenty of fluids.  Prune juice may be helpful.  You may use a stool softener, such as Colace (over the counter) 100 mg twice a day.  Use MiraLax (over the counter) for constipation as needed.   Increase activity slowly as tolerated      Weight Bearing as taught in Physical Therapy      Comments:   Use a walker or crutches as instructed.   Discharge wound care:      Comments:   Change your bandage as instructed by your health care  providers.  Keep your incision clean and dry.  Pat dry after showering.  DO NOT put lotion or powder on your incision.      Follow-up Information    Follow up with BROOKS,DAHARI D. Make an appointment in 2 weeks.   Contact information:   [facility] [address] [place] [address] [phone]         Disposition at DC: STABLE Discharge Plan: DC to home with home health   Signed: Gwinda Maine 07/23/2011, 10:54 AM

## 2012-11-23 IMAGING — CR DG LUMBAR SPINE 2-3V
2 series · 2 of 2 positions shown · non-contrast
Comparison: Intraoperative radiographs performed 07/19/2011

CLINICAL DATA: Postop radiograph, status post lumbar fusion.

LUMBAR SPINE - 2-3 VIEW

[t lumbar spine ap]
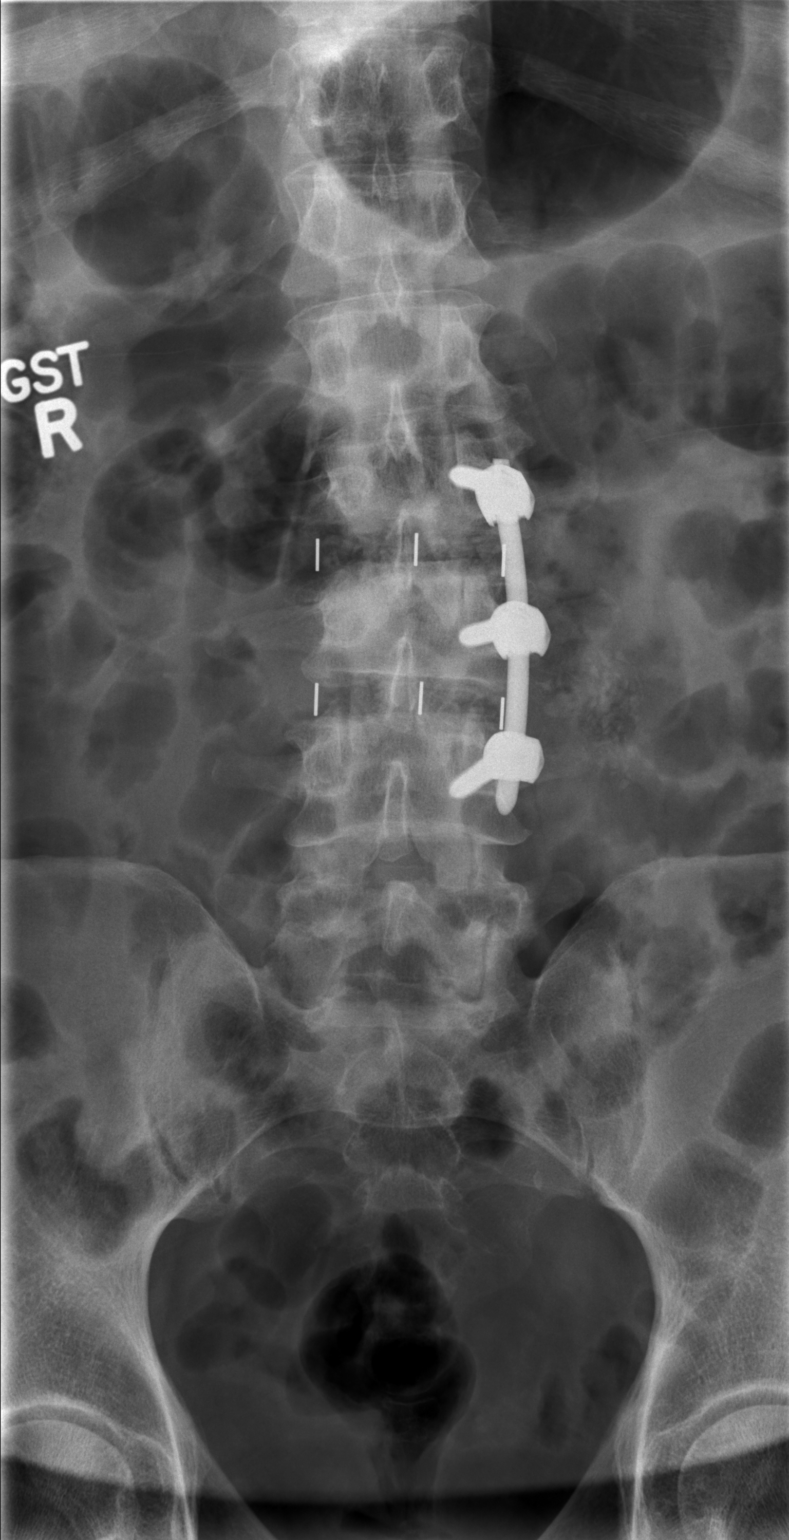

[t lumbar spine lat]
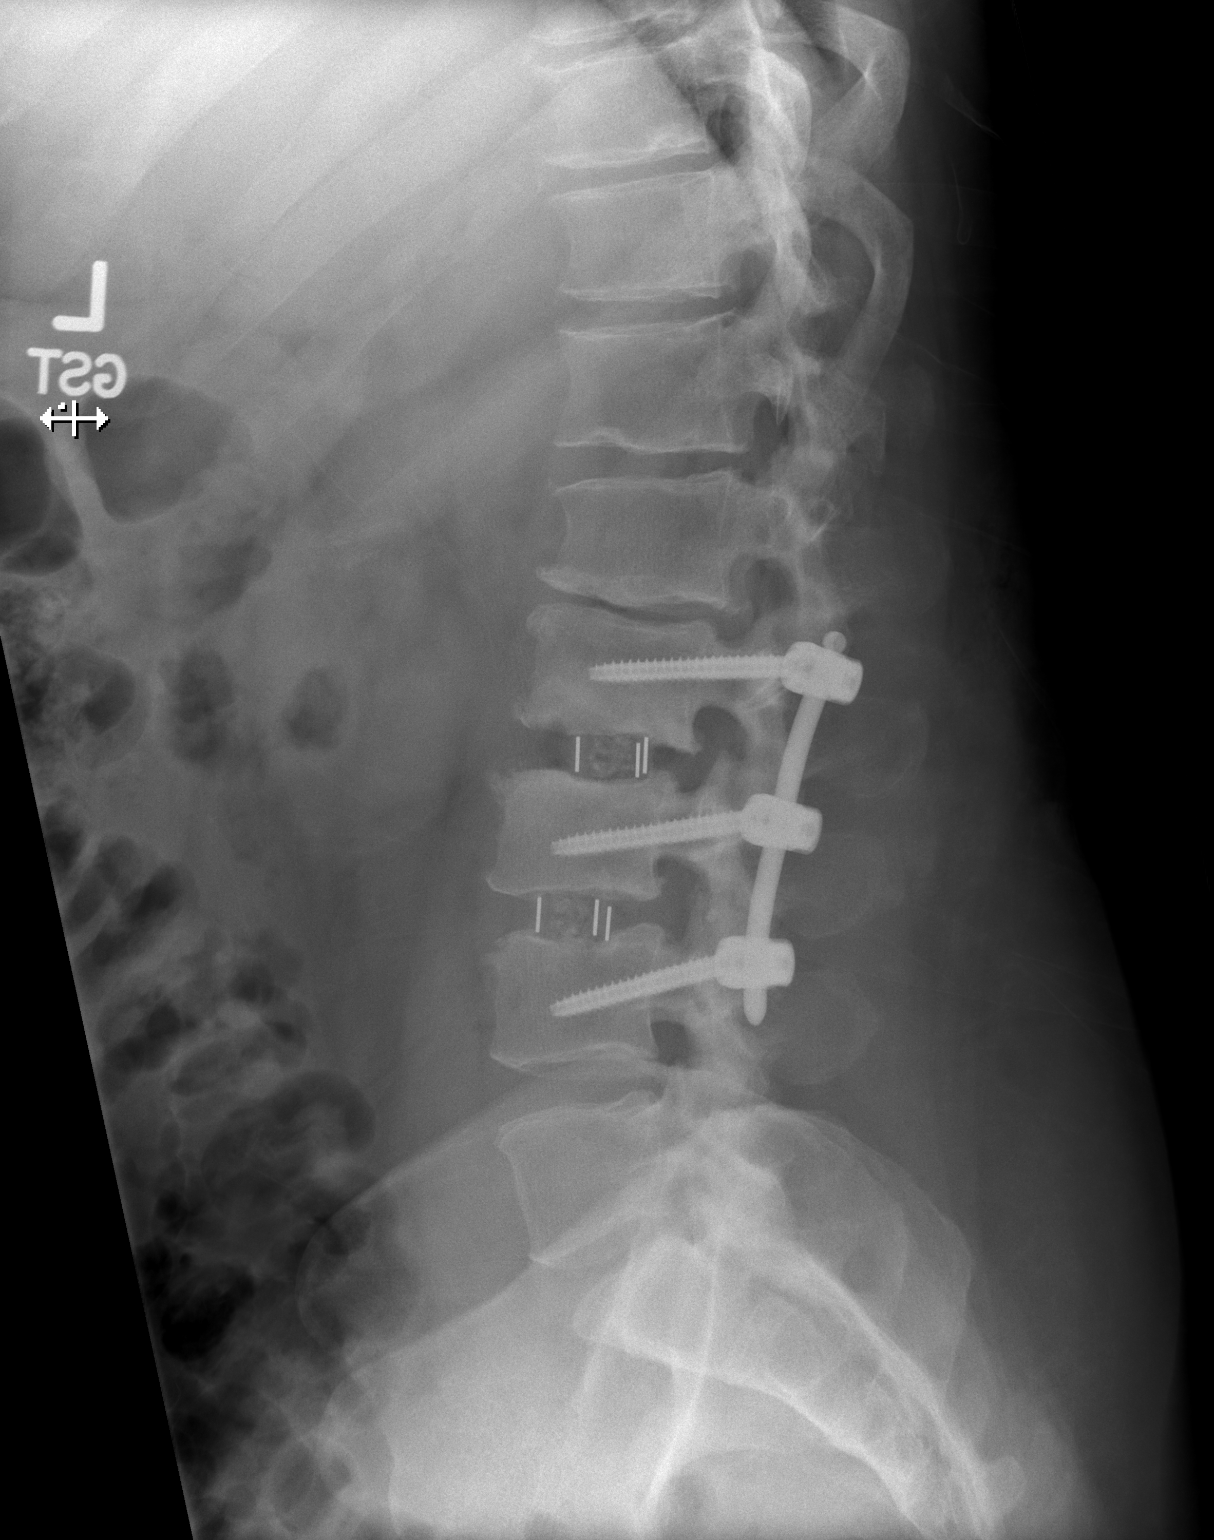

[2 of 2 positions shown; findings below may reference images not displayed]

FINDINGS: The patient is status post left-sided unilateral lumbar
spinal fusion at L2-L4; spinal fusion hardware is grossly
unremarkable in appearance.  Visualized screws are noted overlying
the pedicles.  There is no evidence of fracture or subluxation.
There is chronic disc space narrowing at L1-L2.  Facet disease is
noted at the lower lumbar spine.

The visualized bowel gas pattern is unremarkable.
IMPRESSION: Status post unilateral lumbar spinal fusion at L2-L4; spinal fusion
hardware grossly unremarkable in appearance.  No evidence of
fracture or subluxation.

## 2014-05-07 ENCOUNTER — Other Ambulatory Visit: Payer: Self-pay | Admitting: Orthopedic Surgery

## 2014-05-07 NOTE — Progress Notes (Signed)
Preoperative surgical orders have been place into the Epic hospital system for Sean Miles on 05/07/2014, 1:56 PM  by Patrica Duel for surgery on 06/08/2014.  Preop Total Knee orders including Experal, IV Tylenol, and IV Decadron as long as there are no contraindications to the above medications. Avel Peace, PA-C

## 2014-05-29 ENCOUNTER — Encounter (HOSPITAL_COMMUNITY): Payer: Self-pay | Admitting: Pharmacy Technician

## 2014-05-29 NOTE — Patient Instructions (Addendum)
Sean Miles  05/29/2014                           YOUR PROCEDURE IS SCHEDULED ON: 06/08/14                ENTER FROM [address] - GO TO [address]ARKING DECK               LOOK FOR [employer] [facility]  / [employer]                              FOLLOW  SIGNS TO SHORT STAY CENTER                 ARRIVE AT SHORT STAY AT:  8:30 AM               CALL THIS NUMBER IF ANY PROBLEMS THE DAY OF SURGERY :               [phone]                                REMEMBER:   Do not eat food or drink liquids AFTER MIDNIGHT              STOP ASPIRIN / HERBAL MEDS / ANTI-INFLAMMATORY MED /VITAMINS 5-7 DAYS PREOP               Take these medicines the morning of surgery with               A SIPS OF WATER :   NONE              BRING C-PAP MASK AND TUBING TO HOSPITAL   Do not wear jewelry, make-up   Do not wear lotions, powders, or perfumes.   Do not shave legs or underarms 12 hrs. before surgery (men may shave face)  Do not bring valuables to the hospital.  Contacts, dentures or bridgework may not be worn into surgery.  Leave suitcase in the car. After surgery it may be brought to your room.  For patients admitted to the hospital more than one night, checkout time is            11:00 AM                                                     ________________________________________________________________________                                                                                                  [facility] - PREPARING FOR SURGERY  Before surgery, you can play an important role.  Because skin is not sterile, your skin needs to be as free of germs as possible.  You can reduce the number of germs on your  skin by washing with CHG (chlorahexidine gluconate) soap before surgery.  CHG is an antiseptic cleaner which kills germs and bonds with the skin to continue killing germs even after washing. Please DO NOT use if you have an allergy to CHG or antibacterial soaps.  If your skin becomes  reddened/irritated stop using the CHG and inform your nurse when you arrive at Short Stay. Do not shave (including legs and underarms) for at least 48 hours prior to the first CHG shower.  You may shave your face. Please follow these instructions carefully:   1.  Shower with CHG Soap the night before surgery and the  morning of Surgery.   2.  If you choose to wash your hair, wash your hair first as usual with your  normal  Shampoo.   3.  After you shampoo, rinse your hair and body thoroughly to remove the  shampoo.                                         4.  Use CHG as you would any other liquid soap.  You can apply chg directly  to the skin and wash . Gently wash with scrungie or clean wascloth    5.  Apply the CHG Soap to your body ONLY FROM THE NECK DOWN.   Do not use on open                           Wound or open sores. Avoid contact with eyes, ears mouth and genitals (private parts).                        Genitals (private parts) with your normal soap.              6.  Wash thoroughly, paying special attention to the area where your surgery  will be performed.   7.  Thoroughly rinse your body with warm water from the neck down.   8.  DO NOT shower/wash with your normal soap after using and rinsing off  the CHG Soap .                9.  Pat yourself dry with a clean towel.             10.  Wear clean pajamas.             11.  Place clean sheets on your bed the night of your first shower and do not  sleep with pets.  Day of Surgery : Do not apply any lotions/deodorants the morning of surgery.  Please wear clean clothes to the hospital/surgery center.  FAILURE TO FOLLOW THESE INSTRUCTIONS MAY RESULT IN THE CANCELLATION OF YOUR SURGERY    PATIENT SIGNATURE_________________________________  ______________________________________________________________________     Sean Miles  An incentive Miles is a tool that can help keep your lungs clear and active.  This tool measures how well you are filling your lungs with each breath. Taking long deep breaths may help reverse or decrease the chance of developing breathing (pulmonary) problems (especially infection) following:  A long period of time when you are unable to move or be active. BEFORE THE PROCEDURE   If the Miles includes an indicator to show your best effort, your nurse or respiratory therapist will set it to a desired  goal.  If possible, sit up straight or lean slightly forward. Try not to slouch.  Hold the incentive Miles in an upright position. INSTRUCTIONS FOR USE  1. Sit on the edge of your bed if possible, or sit up as far as you can in bed or on a chair. 2. Hold the incentive Miles in an upright position. 3. Breathe out normally. 4. Place the mouthpiece in your mouth and seal your lips tightly around it. 5. Breathe in slowly and as deeply as possible, raising the piston or the ball toward the top of the column. 6. Hold your breath for 3-5 seconds or for as long as possible. Allow the piston or ball to fall to the bottom of the column. 7. Remove the mouthpiece from your mouth and breathe out normally. 8. Rest for a few seconds and repeat Steps 1 through 7 at least 10 times every 1-2 hours when you are awake. Take your time and take a few normal breaths between deep breaths. 9. The Miles may include an indicator to show your best effort. Use the indicator as a goal to work toward during each repetition. 10. After each set of 10 deep breaths, practice coughing to be sure your lungs are clear. If you have an incision (the cut made at the time of surgery), support your incision when coughing by placing a pillow or rolled up towels firmly against it. Once you are able to get out of bed, walk around indoors and cough well. You may stop using the incentive Miles when instructed by your caregiver.  RISKS AND COMPLICATIONS  Take your time so you do not get dizzy or  light-headed.  If you are in pain, you may need to take or ask for pain medication before doing incentive spirometry. It is harder to take a deep breath if you are having pain. AFTER USE  Rest and breathe slowly and easily.  It can be helpful to keep track of a log of your progress. Your caregiver can provide you with a simple table to help with this. If you are using the Miles at home, follow these instructions: SEEK MEDICAL CARE IF:   You are having difficultly using the Miles.  You have trouble using the Miles as often as instructed.  Your pain medication is not giving enough relief while using the Miles.  You develop fever of 100.5 F (38.1 C) or higher. SEEK IMMEDIATE MEDICAL CARE IF:   You cough up bloody sputum that had not been present before.  You develop fever of 102 F (38.9 C) or greater.  You develop worsening pain at or near the incision site. MAKE SURE YOU:   Understand these instructions.  Will watch your condition.  Will get help right away if you are not doing well or get worse. Document Released: 12/18/2006 Document Revised: 10/30/2011 Document Reviewed: 02/18/2007 ExitCare Patient Information 2014 ExitCare, [place].   ________________________________________________________________________  WHAT IS A BLOOD TRANSFUSION? Blood Transfusion Information  A transfusion is the replacement of blood or some of its parts. Blood is made up of multiple cells which provide different functions.  Red blood cells carry oxygen and are used for blood loss replacement.  White blood cells fight against infection.  Platelets control bleeding.  Plasma helps clot blood.  Other blood products are available for specialized needs, such as hemophilia or other clotting disorders. BEFORE THE TRANSFUSION  Who gives blood for transfusions?   Healthy volunteers who are fully evaluated to make sure their blood  is safe. This is blood bank  blood. Transfusion therapy is the safest it has ever been in the practice of medicine. Before blood is taken from a donor, a complete history is taken to make sure that person has no history of diseases nor engages in risky social behavior (examples are intravenous drug use or sexual activity with multiple partners). The donor's travel history is screened to minimize risk of transmitting infections, such as malaria. The donated blood is tested for signs of infectious diseases, such as HIV and hepatitis. The blood is then tested to be sure it is compatible with you in order to minimize the chance of a transfusion reaction. If you or a relative donates blood, this is often done in anticipation of surgery and is not appropriate for emergency situations. It takes many days to process the donated blood. RISKS AND COMPLICATIONS Although transfusion therapy is very safe and saves many lives, the main dangers of transfusion include:   Getting an infectious disease.  Developing a transfusion reaction. This is an allergic reaction to something in the blood you were given. Every precaution is taken to prevent this. The decision to have a blood transfusion has been considered carefully by your caregiver before blood is given. Blood is not given unless the benefits outweigh the risks. AFTER THE TRANSFUSION  Right after receiving a blood transfusion, you will usually feel much better and more energetic. This is especially true if your red blood cells have gotten low (anemic). The transfusion raises the level of the red blood cells which carry oxygen, and this usually causes an energy increase.  The nurse administering the transfusion will monitor you carefully for complications. HOME CARE INSTRUCTIONS  No special instructions are needed after a transfusion. You may find your energy is better. Speak with your caregiver about any limitations on activity for underlying diseases you may have. SEEK MEDICAL CARE IF:    Your condition is not improving after your transfusion.  You develop redness or irritation at the intravenous (IV) site. SEEK IMMEDIATE MEDICAL CARE IF:  Any of the following symptoms occur over the next 12 hours:  Shaking chills.  You have a temperature by mouth above 102 F (38.9 C), not controlled by medicine.  Chest, back, or muscle pain.  People around you feel you are not acting correctly or are confused.  Shortness of breath or difficulty breathing.  Dizziness and fainting.  You get a rash or develop hives.  You have a decrease in urine output.  Your urine turns a dark color or changes to pink, red, or brown. Any of the following symptoms occur over the next 10 days:  You have a temperature by mouth above 102 F (38.9 C), not controlled by medicine.  Shortness of breath.  Weakness after normal activity.  The white part of the eye turns yellow (jaundice).  You have a decrease in the amount of urine or are urinating less often.  Your urine turns a dark color or changes to pink, red, or brown. Document Released: 08/04/2000 Document Revised: 10/30/2011 Document Reviewed: 03/23/2008 [facility] LLCExit[facility] Patient Information 2014 [place], [place].  _______________________________________________________________________

## 2014-06-01 ENCOUNTER — Encounter (HOSPITAL_COMMUNITY): Payer: Self-pay

## 2014-06-01 ENCOUNTER — Ambulatory Visit (HOSPITAL_COMMUNITY)
Admission: RE | Admit: 2014-06-01 | Discharge: 2014-06-01 | Disposition: A | Discharge status: Home / Self Care | Payer: 59 | Source: Ambulatory Visit | Attending: Anesthesiology | Admitting: Anesthesiology

## 2014-06-01 ENCOUNTER — Encounter (HOSPITAL_COMMUNITY)
Admission: RE | Admit: 2014-06-01 | Discharge: 2014-06-01 | Disposition: A | Discharge status: Home / Self Care | Payer: 59 | Source: Ambulatory Visit | Attending: Orthopedic Surgery | Admitting: Orthopedic Surgery

## 2014-06-01 DIAGNOSIS — Z01818 Encounter for other preprocedural examination: Secondary | ICD-10-CM | POA: Diagnosis present

## 2014-06-01 DIAGNOSIS — I10 Essential (primary) hypertension: Secondary | ICD-10-CM

## 2014-06-01 DIAGNOSIS — M179 Osteoarthritis of knee, unspecified: Secondary | ICD-10-CM | POA: Insufficient documentation

## 2014-06-01 HISTORY — DX: Arthropathic psoriasis, unspecified: L40.50

## 2014-06-01 LAB — COMPREHENSIVE METABOLIC PANEL
ALBUMIN: 4.2 g/dL (ref 3.5–5.2)
ALT: 30 U/L (ref 0–53)
ANION GAP: 15 (ref 5–15)
AST: 29 U/L (ref 0–37)
Alkaline Phosphatase: 67 U/L (ref 39–117)
BUN: 18 mg/dL (ref 6–23)
CALCIUM: 9.7 mg/dL (ref 8.4–10.5)
CO2: 26 mEq/L (ref 19–32)
Chloride: 97 mEq/L (ref 96–112)
Creatinine, Ser: 0.85 mg/dL (ref 0.50–1.35)
GFR calc non Af Amer: >90 mL/min (ref >90)
GLUCOSE: 97 mg/dL (ref 70–99)
Potassium: 4.7 mEq/L (ref 3.7–5.3)
Sodium: 138 mEq/L (ref 137–147)
TOTAL PROTEIN: 7.4 g/dL (ref 6.0–8.3)
Total Bilirubin: 0.3 mg/dL (ref 0.3–1.2)

## 2014-06-01 LAB — URINALYSIS, ROUTINE W REFLEX MICROSCOPIC
Bilirubin Urine: NEGATIVE
GLUCOSE, UA: NEGATIVE mg/dL
Hgb urine dipstick: NEGATIVE
Ketones, ur: NEGATIVE mg/dL
LEUKOCYTES UA: NEGATIVE
Nitrite: NEGATIVE
PH: 5.5 (ref 5.0–8.0)
PROTEIN: NEGATIVE mg/dL
Specific Gravity, Urine: 1.01 (ref 1.005–1.030)
Urobilinogen, UA: 0.2 mg/dL (ref 0.0–1.0)

## 2014-06-01 LAB — CBC
HCT: 38.6 % — ABNORMAL LOW (ref 39.0–52.0)
Hemoglobin: 12.7 g/dL — ABNORMAL LOW (ref 13.0–17.0)
MCH: 29.3 pg (ref 26.0–34.0)
MCHC: 32.9 g/dL (ref 30.0–36.0)
MCV: 88.9 fL (ref 78.0–100.0)
Platelets: 312 10*3/uL (ref 150–400)
RBC: 4.34 MIL/uL (ref 4.22–5.81)
RDW: 12.7 % (ref 11.5–15.5)
WBC: 11.3 10*3/uL — ABNORMAL HIGH (ref 4.0–10.5)

## 2014-06-01 LAB — ABO/RH: ABO/RH(D): O POS

## 2014-06-01 LAB — SURGICAL PCR SCREEN
MRSA, PCR: NEGATIVE
STAPHYLOCOCCUS AUREUS: NEGATIVE

## 2014-06-01 LAB — PROTIME-INR
INR: 1.01 (ref 0.00–1.49)
Prothrombin Time: 13.4 seconds (ref 11.6–15.2)

## 2014-06-01 LAB — APTT: aPTT: 28 seconds (ref 24–37)

## 2014-06-07 ENCOUNTER — Other Ambulatory Visit: Payer: Self-pay | Admitting: Orthopedic Surgery

## 2014-06-07 NOTE — H&P (Signed)
Sean Miles DOB: October 27, 1960 Married / Language: [place] / Race: White Male Date of Admission:  06/08/2014 Chief Complaint:  Left Knee Pain History of Present Illness  The patient is a 53 year old male who comes in  for a preoperative history and physical. The patient is scheduled for a left total knee arthroplasty to be performed by Dr. Gus RankinFrank V. Aluisio, [facility] on 06-08-2014. The patient is a 53 year old male who presents with knee complaints. The patient is seen  for a surgical consult (from Dr. [name]). The patient reports left knee symptoms including: pain, instability, giving way and weakness .The patient feels that the symptoms are worsening. The patient has the current diagnosis of knee osteoarthritis. Previous work-up for this problem has included knee x-rays, knee MRI and arthroscopy (08/18/13). Past treatment for this problem has included intra-articular injection of corticosteroids (as well as Synvisc) and opioid analgesics. Risk factors include total knee replacement (on the right by Dr. Wyline MoodWeller in [facility]). Sean Miles has had problems with this knee for quite a while now, must worse since October or November of last year. He had an arthroscopy done by Dr. [name] and ended up having quite a bit of arthritis, especially patellofemoral and medial. He did not improve after the arthroscopy. He subsequently has had cortisone and viscosupplement injections without benefit. The knee is hurting at all times. It is limiting what he can and cannot do.  He has previously had his right knee replaced by Dr. Wyline MoodWeller and he has done very well with that.  He would now like to proceed with the left knee surgery. Risks and benefits of the surgery have been discussed with the patient and they elect to proceed with surgery.  There are on active contraindications to upcoming procedure such as ongoing infection or progressive neurological disease.   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 mother Heart disease in male family member before age 55 Congestive Heart Failure mother Severe allergy sister Cerebrovascular Accident First Degree Relatives. father Chronic Obstructive Lung Disease mother Diabetes Mellitus father  Social History  Living situation live with spouse Marital status married Number of flights of stairs before winded 2-3 Alcohol use Moderate alcohol use. current drinker; drinks beer; 8-14 per week Tobacco use Never smoker. Drug/Alcohol Rehab (Previously) no Exercise Exercises rarely; does running / walking and individual sport Tobacco / smoke exposure no Drug/Alcohol Rehab (Currently) no Current work status working full time Illicit drug use no Children 2 Post-Surgical Plans Home Advance Directives Living Will, Healthcare POA  Medication History  Norco (5-325MG  Tablet, 1-2 Oral every 4-6 hours prn pain, Taken starting 04/28/2014) Active. Diclofenac Sodium (75MG  Tablet, Oral two times daily) Active. Fenofibrate (200MG  Capsule, 1 Oral daily) Active. Pravastatin Sodium (80MG  Tablet, 1 Oral daily) Active. Xanax (0.5MG  Tablet, 1-2 Oral as needed) Active. Folic Acid (1MG  Tablet, Oral) Active. AmLODIPine Besylate (5MG  Tablet, 1 Oral daily) Active. Benazepril-Hydrochlorothiazide (20-12.5MG  Tablet, 1 Oral two times daily) Active. Armour Thyroid  (30MG  Tablet, Oral) Active. Methotrexate (2.5MG  Tablet, 6 Oral once weekly) Active. Henderson Baltimoretezla (30MG   Tablet, Oral) Active.  Past Surgical History Total Knee Replacement right Rotator Cuff Repair left Back Surgery XLIF S/P Knee Arthroscopy (V58.43  Z48.89) left   Review of Systems General Not Present- Chills, Fatigue, Fever, Memory Loss, Night Sweats, Weight Gain and Weight Loss. Skin Not Present- Eczema, Hives, Itching, Lesions and Rash. HEENT Not Present- Dentures, Double Vision, Headache, Hearing Loss, Tinnitus and Visual Loss. Respiratory Not Present- Allergies, Chronic Cough, Coughing up blood, Shortness of breath at rest and Shortness of breath with exertion. Cardiovascular Not Present- Chest Pain, Difficulty Breathing Lying Down, Murmur, Palpitations, Racing/skipping heartbeats and Swelling. Gastrointestinal Not Present- Abdominal Pain, Bloody Stool, Constipation, Diarrhea, Difficulty Swallowing, Heartburn, Jaundice, Loss of appetitie, Nausea and Vomiting. Male Genitourinary Not Present- Blood in Urine, Discharge, Flank Pain, Incontinence, Painful Urination, Urgency, Urinary frequency, Urinary Retention, Urinating at Night and Weak urinary stream. Musculoskeletal Present- Joint Pain, Joint Swelling and Morning Stiffness. Not Present- Back Pain, Muscle Pain, Muscle Weakness and Spasms. Neurological Not Present- Blackout spells, Difficulty with balance, Dizziness, Paralysis, Tremor and Weakness. Psychiatric Not Present- Insomnia.   Vitals Weight: 290 lb Height: 70in Weight was reported by patient. Height was reported by patient. Body Surface Area: 2.55 m Body Mass Index: 41.61 kg/m BP: 138/72 (Sitting, Right Arm, Standard)    Physical Exam  General Mental Status -Alert, cooperative and good historian. General Appearance-pleasant, Not in acute distress. Orientation-Oriented X3. Build & Nutrition-Well nourished and Well developed.  Head and  Neck Head-normocephalic, atraumatic . Neck Global Assessment - supple, no bruit auscultated on the right, no bruit auscultated on the left.  Eye Pupil - Bilateral-Regular and Round. Motion - Bilateral-EOMI.  Chest and Lung Exam Auscultation Breath sounds - clear at anterior chest wall and clear at posterior chest wall. Adventitious sounds - No Adventitious sounds.  Cardiovascular Auscultation Rhythm - Regular rate and rhythm. Heart Sounds - S1 WNL and S2 WNL. Murmurs & Other Heart Sounds - Auscultation of the heart reveals - No Murmurs.  Abdomen Palpation/Percussion Tenderness - Abdomen is non-tender to palpation. Rigidity (guarding) - Abdomen is soft. Auscultation Auscultation of the abdomen reveals - Bowel sounds normal.  Male Genitourinary Note: Not done, not pertinent to present illness   Musculoskeletal Note: On exam he is a well developed male alert and oriented in no apparent distress. Evaluation of his right knee shows a well healed scar. Range of motion 0-125 degrees with no tenderness or instability.  Left knee with slight effusion. Range of motion about 5-125 degrees. Marked crepitus on range of motion. Significant tenderness medial and lateral with about a 5-mm opening on valgus stressing.  RADIOGRAPHS Radiographs are reviewed. Plain films from October show he had bone-on-bone arthritis in the patellofemoral compartment. The medial compartment was only slightly narrow. Previous scope pictures and he had a complete bone-on-bone patellofemoral compartment and a large area of exposed bone of the medial femoral condyle.   Assessment & Plan  Primary osteoarthritis of left knee (715.16  M17.12)  Note:Plan is for a Left Total Knee Replacement by Dr. Aluisio.  Plan is to go home.  PCP - Dr. Lisa Miller  Please note that the patient has gotten nauseated with anesthesia in the past.  The patient does not have any contraindications and will receive TXA  (tranexamic acid) prior to surgery.  Signed electronically by Clydene Burack L Lakeia Bradshaw, III PA-C 

## 2014-06-08 ENCOUNTER — Inpatient Hospital Stay (HOSPITAL_COMMUNITY): Payer: 59 | Admitting: Anesthesiology

## 2014-06-08 ENCOUNTER — Encounter (HOSPITAL_COMMUNITY)
Admission: RE | Disposition: A | Discharge status: Home Health | Payer: Self-pay | Source: Ambulatory Visit | Attending: Orthopedic Surgery

## 2014-06-08 ENCOUNTER — Encounter (HOSPITAL_COMMUNITY): Payer: 59 | Admitting: Anesthesiology

## 2014-06-08 ENCOUNTER — Inpatient Hospital Stay (HOSPITAL_COMMUNITY)
Admission: RE | Admit: 2014-06-08 | Discharge: 2014-06-10 | DRG: 470 | Disposition: A | Discharge status: Home Health | Payer: 59 | Source: Ambulatory Visit | Attending: Orthopedic Surgery | Admitting: Orthopedic Surgery

## 2014-06-08 ENCOUNTER — Encounter (HOSPITAL_COMMUNITY): Payer: Self-pay | Admitting: *Deleted

## 2014-06-08 DIAGNOSIS — G4730 Sleep apnea, unspecified: Secondary | ICD-10-CM | POA: Diagnosis present

## 2014-06-08 DIAGNOSIS — Z8261 Family history of arthritis: Secondary | ICD-10-CM

## 2014-06-08 DIAGNOSIS — E780 Pure hypercholesterolemia: Secondary | ICD-10-CM | POA: Diagnosis present

## 2014-06-08 DIAGNOSIS — M1710 Unilateral primary osteoarthritis, unspecified knee: Secondary | ICD-10-CM | POA: Diagnosis present

## 2014-06-08 DIAGNOSIS — M25762 Osteophyte, left knee: Secondary | ICD-10-CM | POA: Diagnosis present

## 2014-06-08 DIAGNOSIS — Z833 Family history of diabetes mellitus: Secondary | ICD-10-CM | POA: Diagnosis not present

## 2014-06-08 DIAGNOSIS — Z823 Family history of stroke: Secondary | ICD-10-CM | POA: Diagnosis not present

## 2014-06-08 DIAGNOSIS — M1712 Unilateral primary osteoarthritis, left knee: Secondary | ICD-10-CM

## 2014-06-08 DIAGNOSIS — E039 Hypothyroidism, unspecified: Secondary | ICD-10-CM | POA: Diagnosis present

## 2014-06-08 DIAGNOSIS — M25562 Pain in left knee: Secondary | ICD-10-CM | POA: Diagnosis present

## 2014-06-08 DIAGNOSIS — I10 Essential (primary) hypertension: Secondary | ICD-10-CM | POA: Diagnosis present

## 2014-06-08 DIAGNOSIS — Z825 Family history of asthma and other chronic lower respiratory diseases: Secondary | ICD-10-CM

## 2014-06-08 DIAGNOSIS — F419 Anxiety disorder, unspecified: Secondary | ICD-10-CM | POA: Diagnosis present

## 2014-06-08 DIAGNOSIS — Z6841 Body Mass Index (BMI) 40.0 and over, adult: Secondary | ICD-10-CM

## 2014-06-08 DIAGNOSIS — Z79899 Other long term (current) drug therapy: Secondary | ICD-10-CM

## 2014-06-08 DIAGNOSIS — M179 Osteoarthritis of knee, unspecified: Secondary | ICD-10-CM | POA: Diagnosis present

## 2014-06-08 DIAGNOSIS — Z8249 Family history of ischemic heart disease and other diseases of the circulatory system: Secondary | ICD-10-CM | POA: Diagnosis not present

## 2014-06-08 HISTORY — PX: TOTAL KNEE ARTHROPLASTY: SHX125

## 2014-06-08 LAB — GLUCOSE, CAPILLARY: Glucose-Capillary: 117 mg/dL — ABNORMAL HIGH (ref 70–99)

## 2014-06-08 LAB — TYPE AND SCREEN
ABO/RH(D): O POS
Antibody Screen: NEGATIVE

## 2014-06-08 SURGERY — ARTHROPLASTY, KNEE, TOTAL
Anesthesia: General | Site: Knee | Laterality: Left

## 2014-06-08 MED ORDER — FLEET ENEMA 7-19 GM/118ML RE ENEM: 1.0000 | ENEMA | Freq: Once | RECTAL | Status: AC | PRN

## 2014-06-08 MED ORDER — SUFENTANIL CITRATE 50 MCG/ML IV SOLN
INTRAVENOUS | Status: AC
  Filled 2014-06-08: qty 1

## 2014-06-08 MED ORDER — DEXTROSE-NACL 5-0.9 % IV SOLN
INTRAVENOUS | Status: DC
  Administered 2014-06-08 – 2014-06-09 (×2): via INTRAVENOUS

## 2014-06-08 MED ORDER — SODIUM CHLORIDE 0.9 % IR SOLN
Status: DC | PRN
  Administered 2014-06-08: 1000 mL

## 2014-06-08 MED ORDER — LIDOCAINE HCL (CARDIAC) 20 MG/ML IV SOLN
INTRAVENOUS | Status: AC
  Filled 2014-06-08: qty 5

## 2014-06-08 MED ORDER — LACTATED RINGERS IV SOLN: INTRAVENOUS | Status: DC

## 2014-06-08 MED ORDER — HYDROMORPHONE HCL 1 MG/ML IJ SOLN
INTRAMUSCULAR | Status: AC
  Filled 2014-06-08: qty 1

## 2014-06-08 MED ORDER — BUPIVACAINE LIPOSOME 1.3 % IJ SUSP
20.0000 mL | Freq: Once | INTRAMUSCULAR | Status: DC
  Filled 2014-06-08: qty 20

## 2014-06-08 MED ORDER — ONDANSETRON HCL 4 MG PO TABS: 4.0000 mg | ORAL_TABLET | Freq: Four times a day (QID) | ORAL | Status: DC | PRN

## 2014-06-08 MED ORDER — SODIUM CHLORIDE 0.9 % IV SOLN: INTRAVENOUS | Status: DC

## 2014-06-08 MED ORDER — ACETAMINOPHEN 10 MG/ML IV SOLN
1000.0000 mg | Freq: Once | INTRAVENOUS | Status: AC
  Administered 2014-06-08: 1000 mg via INTRAVENOUS
  Filled 2014-06-08: qty 100

## 2014-06-08 MED ORDER — TRAMADOL HCL 50 MG PO TABS
50.0000 mg | ORAL_TABLET | Freq: Four times a day (QID) | ORAL | Status: DC | PRN
  Administered 2014-06-09: 50 mg via ORAL
  Administered 2014-06-10 (×2): 100 mg via ORAL
  Filled 2014-06-08: qty 2
  Filled 2014-06-08: qty 1
  Filled 2014-06-08: qty 2

## 2014-06-08 MED ORDER — ACETAMINOPHEN 500 MG PO TABS
1000.0000 mg | ORAL_TABLET | Freq: Four times a day (QID) | ORAL | Status: AC
  Administered 2014-06-08 – 2014-06-09 (×4): 1000 mg via ORAL
  Filled 2014-06-08 (×5): qty 2

## 2014-06-08 MED ORDER — MIDAZOLAM HCL 5 MG/5ML IJ SOLN
INTRAMUSCULAR | Status: DC | PRN
  Administered 2014-06-08 (×2): 2 mg via INTRAVENOUS

## 2014-06-08 MED ORDER — SUFENTANIL CITRATE 50 MCG/ML IV SOLN
INTRAVENOUS | Status: DC | PRN
  Administered 2014-06-08: 15 ug via INTRAVENOUS
  Administered 2014-06-08: 20 ug via INTRAVENOUS
  Administered 2014-06-08 (×2): 10 ug via INTRAVENOUS
  Administered 2014-06-08: 5 ug via INTRAVENOUS
  Administered 2014-06-08: 15 ug via INTRAVENOUS
  Administered 2014-06-08: 10 ug via INTRAVENOUS
  Administered 2014-06-08: 15 ug via INTRAVENOUS

## 2014-06-08 MED ORDER — DIPHENHYDRAMINE HCL 50 MG/ML IJ SOLN
INTRAMUSCULAR | Status: AC
  Filled 2014-06-08: qty 1

## 2014-06-08 MED ORDER — ONDANSETRON HCL 4 MG/2ML IJ SOLN
INTRAMUSCULAR | Status: AC
  Filled 2014-06-08: qty 2

## 2014-06-08 MED ORDER — ROCURONIUM BROMIDE 100 MG/10ML IV SOLN
INTRAVENOUS | Status: AC
Start: 2014-06-08 — End: 2014-06-08
  Filled 2014-06-08: qty 1

## 2014-06-08 MED ORDER — THYROID 60 MG PO TABS
210.0000 mg | ORAL_TABLET | Freq: Every day | ORAL | Status: DC
  Administered 2014-06-08 – 2014-06-09 (×2): 210 mg via ORAL
  Filled 2014-06-08 (×3): qty 1

## 2014-06-08 MED ORDER — BUPIVACAINE HCL 0.25 % IJ SOLN
INTRAMUSCULAR | Status: DC | PRN
  Administered 2014-06-08: 30 mL

## 2014-06-08 MED ORDER — BISACODYL 10 MG RE SUPP: 10.0000 mg | Freq: Every day | RECTAL | Status: DC | PRN

## 2014-06-08 MED ORDER — DEXAMETHASONE SODIUM PHOSPHATE 10 MG/ML IJ SOLN
10.0000 mg | Freq: Once | INTRAMUSCULAR | Status: AC
  Administered 2014-06-08: 10 mg via INTRAVENOUS

## 2014-06-08 MED ORDER — FENOFIBRATE 160 MG PO TABS
160.0000 mg | ORAL_TABLET | Freq: Every day | ORAL | Status: DC
Start: 2014-06-08 — End: 2014-06-10
  Administered 2014-06-08 – 2014-06-09 (×2): 160 mg via ORAL
  Filled 2014-06-08 (×3): qty 1

## 2014-06-08 MED ORDER — RIVAROXABAN 10 MG PO TABS
10.0000 mg | ORAL_TABLET | Freq: Every day | ORAL | Status: DC
  Administered 2014-06-09 – 2014-06-10 (×2): 10 mg via ORAL
  Filled 2014-06-08 (×4): qty 1

## 2014-06-08 MED ORDER — MORPHINE SULFATE 2 MG/ML IJ SOLN
1.0000 mg | INTRAMUSCULAR | Status: DC | PRN
  Administered 2014-06-08 – 2014-06-09 (×9): 2 mg via INTRAVENOUS
  Filled 2014-06-08 (×9): qty 1

## 2014-06-08 MED ORDER — 0.9 % SODIUM CHLORIDE (POUR BTL) OPTIME
TOPICAL | Status: DC | PRN
  Administered 2014-06-08: 1000 mL

## 2014-06-08 MED ORDER — ACETAMINOPHEN 325 MG PO TABS: 650.0000 mg | ORAL_TABLET | Freq: Four times a day (QID) | ORAL | Status: DC | PRN

## 2014-06-08 MED ORDER — GLYCOPYRROLATE 0.2 MG/ML IJ SOLN
INTRAMUSCULAR | Status: DC | PRN
  Administered 2014-06-08: 0.4 mg via INTRAVENOUS

## 2014-06-08 MED ORDER — PROPOFOL 10 MG/ML IV BOLUS
INTRAVENOUS | Status: DC | PRN
  Administered 2014-06-08: 200 mg via INTRAVENOUS

## 2014-06-08 MED ORDER — KETAMINE HCL 10 MG/ML IJ SOLN
INTRAMUSCULAR | Status: DC | PRN
  Administered 2014-06-08: 20 mg via INTRAVENOUS
  Administered 2014-06-08: 30 mg via INTRAVENOUS
  Administered 2014-06-08: 20 mg via INTRAVENOUS

## 2014-06-08 MED ORDER — HYDROMORPHONE HCL 1 MG/ML IJ SOLN
0.2500 mg | INTRAMUSCULAR | Status: AC | PRN
  Administered 2014-06-08 (×8): 0.5 mg via INTRAVENOUS

## 2014-06-08 MED ORDER — FENTANYL CITRATE 0.05 MG/ML IJ SOLN: INTRAMUSCULAR | Status: DC | PRN

## 2014-06-08 MED ORDER — OXYCODONE HCL 5 MG PO TABS
5.0000 mg | ORAL_TABLET | ORAL | Status: DC | PRN
  Administered 2014-06-08 – 2014-06-09 (×5): 10 mg via ORAL
  Filled 2014-06-08 (×5): qty 2

## 2014-06-08 MED ORDER — ONDANSETRON HCL 4 MG/2ML IJ SOLN: 4.0000 mg | Freq: Four times a day (QID) | INTRAMUSCULAR | Status: DC | PRN

## 2014-06-08 MED ORDER — BUPIVACAINE LIPOSOME 1.3 % IJ SUSP
INTRAMUSCULAR | Status: DC | PRN
  Administered 2014-06-08: 20 mL

## 2014-06-08 MED ORDER — LACTATED RINGERS IV SOLN
INTRAVENOUS | Status: DC | PRN
  Administered 2014-06-08 (×3): via INTRAVENOUS

## 2014-06-08 MED ORDER — DIPHENHYDRAMINE HCL 50 MG/ML IJ SOLN
25.0000 mg | Freq: Once | INTRAMUSCULAR | Status: AC
  Administered 2014-06-08: 25 mg via INTRAVENOUS

## 2014-06-08 MED ORDER — SODIUM CHLORIDE 0.9 % IJ SOLN
INTRAMUSCULAR | Status: DC | PRN
  Administered 2014-06-08: 30 mL via INTRAVENOUS

## 2014-06-08 MED ORDER — ACETAMINOPHEN 650 MG RE SUPP: 650.0000 mg | Freq: Four times a day (QID) | RECTAL | Status: DC | PRN

## 2014-06-08 MED ORDER — NEOSTIGMINE METHYLSULFATE 10 MG/10ML IV SOLN
INTRAVENOUS | Status: DC | PRN
  Administered 2014-06-08: 3 mg via INTRAVENOUS

## 2014-06-08 MED ORDER — ONDANSETRON HCL 4 MG/2ML IJ SOLN
INTRAMUSCULAR | Status: DC | PRN
  Administered 2014-06-08: 4 mg via INTRAVENOUS

## 2014-06-08 MED ORDER — METOCLOPRAMIDE HCL 10 MG PO TABS: 5.0000 mg | ORAL_TABLET | Freq: Three times a day (TID) | ORAL | Status: DC | PRN

## 2014-06-08 MED ORDER — SCOPOLAMINE 1 MG/3DAYS TD PT72
MEDICATED_PATCH | TRANSDERMAL | Status: DC | PRN
  Administered 2014-06-08: 1 via TRANSDERMAL

## 2014-06-08 MED ORDER — METOCLOPRAMIDE HCL 5 MG/ML IJ SOLN
5.0000 mg | Freq: Three times a day (TID) | INTRAMUSCULAR | Status: DC | PRN
Start: 2014-06-08 — End: 2014-06-10

## 2014-06-08 MED ORDER — ALPRAZOLAM 0.5 MG PO TABS
0.5000 mg | ORAL_TABLET | Freq: Every evening | ORAL | Status: DC | PRN
  Administered 2014-06-08 – 2014-06-10 (×2): 0.5 mg via ORAL
  Filled 2014-06-08 (×2): qty 1

## 2014-06-08 MED ORDER — DEXAMETHASONE SODIUM PHOSPHATE 10 MG/ML IJ SOLN
INTRAMUSCULAR | Status: AC
  Filled 2014-06-08: qty 1

## 2014-06-08 MED ORDER — SODIUM CHLORIDE 0.9 % IJ SOLN
INTRAMUSCULAR | Status: AC
  Filled 2014-06-08: qty 20

## 2014-06-08 MED ORDER — DEXTROSE 5 % IV SOLN
3.0000 g | INTRAVENOUS | Status: AC
  Administered 2014-06-08: 3 g via INTRAVENOUS
  Filled 2014-06-08: qty 3000

## 2014-06-08 MED ORDER — BUPIVACAINE HCL (PF) 0.25 % IJ SOLN
INTRAMUSCULAR | Status: AC
  Filled 2014-06-08: qty 30

## 2014-06-08 MED ORDER — SCOPOLAMINE 1 MG/3DAYS TD PT72
MEDICATED_PATCH | TRANSDERMAL | Status: AC
  Filled 2014-06-08: qty 1

## 2014-06-08 MED ORDER — DOCUSATE SODIUM 100 MG PO CAPS
100.0000 mg | ORAL_CAPSULE | Freq: Two times a day (BID) | ORAL | Status: DC
  Administered 2014-06-08 – 2014-06-10 (×4): 100 mg via ORAL

## 2014-06-08 MED ORDER — CHLORHEXIDINE GLUCONATE 4 % EX LIQD: 60.0000 mL | Freq: Once | CUTANEOUS | Status: DC

## 2014-06-08 MED ORDER — SODIUM CHLORIDE 0.9 % IJ SOLN
INTRAMUSCULAR | Status: AC
  Filled 2014-06-08: qty 50

## 2014-06-08 MED ORDER — ROCURONIUM BROMIDE 100 MG/10ML IV SOLN
INTRAVENOUS | Status: DC | PRN
  Administered 2014-06-08: 5 mg via INTRAVENOUS
  Administered 2014-06-08: 25 mg via INTRAVENOUS

## 2014-06-08 MED ORDER — POLYETHYLENE GLYCOL 3350 17 G PO PACK: 17.0000 g | PACK | Freq: Every day | ORAL | Status: DC | PRN

## 2014-06-08 MED ORDER — LIDOCAINE HCL (CARDIAC) 20 MG/ML IV SOLN
INTRAVENOUS | Status: DC | PRN
  Administered 2014-06-08: 75 mg via INTRAVENOUS
  Administered 2014-06-08: 25 mg via INTRATRACHEAL

## 2014-06-08 MED ORDER — KETOROLAC TROMETHAMINE 15 MG/ML IJ SOLN: 7.5000 mg | Freq: Four times a day (QID) | INTRAMUSCULAR | Status: AC | PRN

## 2014-06-08 MED ORDER — PROPOFOL 10 MG/ML IV BOLUS
INTRAVENOUS | Status: AC
  Filled 2014-06-08: qty 20

## 2014-06-08 MED ORDER — PHENOL 1.4 % MT LIQD: 1.0000 | OROMUCOSAL | Status: DC | PRN

## 2014-06-08 MED ORDER — AMLODIPINE BESYLATE 5 MG PO TABS
5.0000 mg | ORAL_TABLET | Freq: Every day | ORAL | Status: DC
  Administered 2014-06-08 – 2014-06-09 (×2): 5 mg via ORAL
  Filled 2014-06-08 (×3): qty 1

## 2014-06-08 MED ORDER — MIDAZOLAM HCL 2 MG/2ML IJ SOLN
INTRAMUSCULAR | Status: AC
  Filled 2014-06-08: qty 2

## 2014-06-08 MED ORDER — ONDANSETRON HCL 4 MG/2ML IJ SOLN: 4.0000 mg | Freq: Once | INTRAMUSCULAR | Status: DC | PRN

## 2014-06-08 MED ORDER — CEFAZOLIN SODIUM-DEXTROSE 2-3 GM-% IV SOLR
2.0000 g | Freq: Four times a day (QID) | INTRAVENOUS | Status: AC
  Administered 2014-06-08 – 2014-06-09 (×2): 2 g via INTRAVENOUS
  Filled 2014-06-08 (×2): qty 50

## 2014-06-08 MED ORDER — METHOCARBAMOL 1000 MG/10ML IJ SOLN
500.0000 mg | Freq: Four times a day (QID) | INTRAVENOUS | Status: DC | PRN
  Administered 2014-06-08: 500 mg via INTRAVENOUS
  Filled 2014-06-08: qty 5

## 2014-06-08 MED ORDER — DEXAMETHASONE SODIUM PHOSPHATE 10 MG/ML IJ SOLN
10.0000 mg | Freq: Once | INTRAMUSCULAR | Status: AC
  Administered 2014-06-09: 10 mg via INTRAVENOUS
  Filled 2014-06-08: qty 1

## 2014-06-08 MED ORDER — SUCCINYLCHOLINE CHLORIDE 20 MG/ML IJ SOLN
INTRAMUSCULAR | Status: DC | PRN
  Administered 2014-06-08: 100 mg via INTRAVENOUS

## 2014-06-08 MED ORDER — TRANEXAMIC ACID 100 MG/ML IV SOLN
1000.0000 mg | INTRAVENOUS | Status: AC
  Administered 2014-06-08: 1000 mg via INTRAVENOUS
  Filled 2014-06-08: qty 10

## 2014-06-08 MED ORDER — NEOSTIGMINE METHYLSULFATE 10 MG/10ML IV SOLN
INTRAVENOUS | Status: AC
  Filled 2014-06-08: qty 1

## 2014-06-08 MED ORDER — DIPHENHYDRAMINE HCL 12.5 MG/5ML PO ELIX
12.5000 mg | ORAL_SOLUTION | ORAL | Status: DC | PRN
  Administered 2014-06-08 – 2014-06-09 (×2): 25 mg via ORAL
  Filled 2014-06-08 (×2): qty 10

## 2014-06-08 MED ORDER — GLYCOPYRROLATE 0.2 MG/ML IJ SOLN
INTRAMUSCULAR | Status: AC
  Filled 2014-06-08: qty 2

## 2014-06-08 MED ORDER — MENTHOL 3 MG MT LOZG
1.0000 | LOZENGE | OROMUCOSAL | Status: DC | PRN
  Filled 2014-06-08: qty 9

## 2014-06-08 MED ORDER — KETAMINE HCL 10 MG/ML IJ SOLN
INTRAMUSCULAR | Status: AC
  Filled 2014-06-08: qty 1

## 2014-06-08 MED ORDER — METHOCARBAMOL 500 MG PO TABS
500.0000 mg | ORAL_TABLET | Freq: Four times a day (QID) | ORAL | Status: DC | PRN
  Administered 2014-06-08 – 2014-06-10 (×4): 500 mg via ORAL
  Filled 2014-06-08 (×4): qty 1

## 2014-06-08 SURGICAL SUPPLY — 58 items
BAG ZIPLOCK 12X15 (MISCELLANEOUS) ×2 IMPLANT
BANDAGE ELASTIC 6 VELCRO ST LF (GAUZE/BANDAGES/DRESSINGS) ×2 IMPLANT
BANDAGE ESMARK 6X9 LF (GAUZE/BANDAGES/DRESSINGS) ×1 IMPLANT
BLADE SAG 18X100X1.27 (BLADE) ×2 IMPLANT
BLADE SAW SGTL 11.0X1.19X90.0M (BLADE) ×2 IMPLANT
BNDG ESMARK 6X9 LF (GAUZE/BANDAGES/DRESSINGS) ×2
BOWL SMART MIX CTS (DISPOSABLE) ×2 IMPLANT
CAP KNEE ATTUNE RP ×2 IMPLANT
CEMENT HV SMART SET (Cement) ×4 IMPLANT
CUFF TOURN SGL QUICK 34 (TOURNIQUET CUFF) ×1
CUFF TRNQT CYL 34X4X40X1 (TOURNIQUET CUFF) ×1 IMPLANT
DECANTER SPIKE VIAL GLASS SM (MISCELLANEOUS) ×2 IMPLANT
DRAPE EXTREMITY TIBURON (DRAPES) ×2 IMPLANT
DRAPE POUCH INSTRU U-SHP 10X18 (DRAPES) ×2 IMPLANT
DRAPE U-SHAPE 47X51 STRL (DRAPES) ×2 IMPLANT
DRSG ADAPTIC 3X8 NADH LF (GAUZE/BANDAGES/DRESSINGS) ×2 IMPLANT
DRSG PAD ABDOMINAL 8X10 ST (GAUZE/BANDAGES/DRESSINGS) ×2 IMPLANT
DURAPREP 26ML APPLICATOR (WOUND CARE) ×2 IMPLANT
ELECT REM PT RETURN 9FT ADLT (ELECTROSURGICAL) ×2
ELECTRODE REM PT RTRN 9FT ADLT (ELECTROSURGICAL) ×1 IMPLANT
EVACUATOR 1/8 PVC DRAIN (DRAIN) ×2 IMPLANT
FACESHIELD WRAPAROUND (MASK) ×10 IMPLANT
GAUZE SPONGE 4X4 12PLY STRL (GAUZE/BANDAGES/DRESSINGS) ×2 IMPLANT
GLOVE BIO SURGEON STRL SZ7.5 (GLOVE) IMPLANT
GLOVE BIO SURGEON STRL SZ8 (GLOVE) ×2 IMPLANT
GLOVE BIOGEL PI IND STRL 6.5 (GLOVE) IMPLANT
GLOVE BIOGEL PI IND STRL 8 (GLOVE) ×1 IMPLANT
GLOVE BIOGEL PI INDICATOR 6.5 (GLOVE)
GLOVE BIOGEL PI INDICATOR 8 (GLOVE) ×1
GLOVE SURG SS PI 6.5 STRL IVOR (GLOVE) IMPLANT
GOWN STRL REUS W/TWL LRG LVL3 (GOWN DISPOSABLE) ×2 IMPLANT
GOWN STRL REUS W/TWL XL LVL3 (GOWN DISPOSABLE) IMPLANT
HANDPIECE INTERPULSE COAX TIP (DISPOSABLE) ×1
IMMOBILIZER KNEE 20 (SOFTGOODS) ×4 IMPLANT
IMMOBILIZER KNEE 20 THIGH 36 (SOFTGOODS) ×1 IMPLANT
KIT BASIN OR (CUSTOM PROCEDURE TRAY) ×2 IMPLANT
MANIFOLD NEPTUNE II (INSTRUMENTS) ×2 IMPLANT
NDL SAFETY ECLIPSE 18X1.5 (NEEDLE) ×2 IMPLANT
NEEDLE HYPO 18GX1.5 SHARP (NEEDLE) ×2
NS IRRIG 1000ML POUR BTL (IV SOLUTION) ×2 IMPLANT
PACK TOTAL JOINT (CUSTOM PROCEDURE TRAY) ×2 IMPLANT
PAD ABD 8X10 STRL (GAUZE/BANDAGES/DRESSINGS) ×2 IMPLANT
PADDING CAST COTTON 6X4 STRL (CAST SUPPLIES) ×2 IMPLANT
POSITIONER SURGICAL ARM (MISCELLANEOUS) ×2 IMPLANT
SET HNDPC FAN SPRY TIP SCT (DISPOSABLE) ×1 IMPLANT
STRIP CLOSURE SKIN 1/2X4 (GAUZE/BANDAGES/DRESSINGS) ×2 IMPLANT
SUCTION FRAZIER 12FR DISP (SUCTIONS) ×2 IMPLANT
SUT MNCRL AB 4-0 PS2 18 (SUTURE) ×2 IMPLANT
SUT VIC AB 2-0 CT1 27 (SUTURE) ×3
SUT VIC AB 2-0 CT1 TAPERPNT 27 (SUTURE) ×3 IMPLANT
SUT VLOC 180 0 24IN GS25 (SUTURE) ×2 IMPLANT
SYR 20CC LL (SYRINGE) ×2 IMPLANT
SYR 50ML LL SCALE MARK (SYRINGE) ×2 IMPLANT
TOWEL OR 17X26 10 PK STRL BLUE (TOWEL DISPOSABLE) ×2 IMPLANT
TOWEL OR NON WOVEN STRL DISP B (DISPOSABLE) IMPLANT
TRAY FOLEY METER SIL LF 16FR (CATHETERS) ×2 IMPLANT
WATER STERILE IRR 1500ML POUR (IV SOLUTION) ×2 IMPLANT
WRAP KNEE MAXI GEL POST OP (GAUZE/BANDAGES/DRESSINGS) ×2 IMPLANT

## 2014-06-08 NOTE — Anesthesia Preprocedure Evaluation (Addendum)
Anesthesia Evaluation  Patient identified by MRN, date of birth, ID band Patient awake    Reviewed: Allergy & Precautions, H&P , NPO status , Patient's Chart, lab work & pertinent test results  History of Anesthesia Complications (+) PONV and history of anesthetic complications  Airway Mallampati: III TM Distance: >3 FB Neck ROM: Full    Dental no notable dental hx. (+) Teeth Intact, Dental Advisory Given   Pulmonary sleep apnea , former smoker,  breath sounds clear to auscultation  Pulmonary exam normal       Cardiovascular Exercise Tolerance: Good hypertension, Pt. on medications Rhythm:Regular Rate:Normal     Neuro/Psych Anxiety negative neurological ROS     GI/Hepatic negative GI ROS, Neg liver ROS,   Endo/Other  Hypothyroidism Morbid obesity  Renal/GU negative Renal ROS  negative genitourinary   Musculoskeletal  (+) Arthritis -, Osteoarthritis,    Abdominal (+) + obese,   Peds negative pediatric ROS (+)  Hematology negative hematology ROS (+)   Anesthesia Other Findings   Reproductive/Obstetrics negative OB ROS                          Anesthesia Physical Anesthesia Plan  ASA: III  Anesthesia Plan: General   Post-op Pain Management:    Induction: Intravenous  Airway Management Planned: Oral ETT  Additional Equipment:   Intra-op Plan:   Post-operative Plan: Extubation in OR  Informed Consent: I have reviewed the patients History and Physical, chart, labs and discussed the procedure including the risks, benefits and alternatives for the proposed anesthesia with the patient or authorized representative who has indicated his/her understanding and acceptance.   Dental advisory given  Plan Discussed with: CRNA  Anesthesia Plan Comments:        Anesthesia Quick Evaluation

## 2014-06-08 NOTE — H&P (View-Only) (Signed)
Sean Miles DOB: October 27, 1960 Married / Language: [place] / Race: White Male Date of Admission:  06/08/2014 Chief Complaint:  Left Knee Pain History of Present Illness  The patient is a 53 year old male who comes in  for a preoperative history and physical. The patient is scheduled for a left total knee arthroplasty to be performed by Dr. Gus RankinFrank V. Aluisio, MD at [facility] on 06-08-2014. The patient is a 53 year old male who presents with knee complaints. The patient is seen  for a surgical consult (from Dr. [name]). The patient reports left knee symptoms including: pain, instability, giving way and weakness .The patient feels that the symptoms are worsening. The patient has the current diagnosis of knee osteoarthritis. Previous work-up for this problem has included knee x-rays, knee MRI and arthroscopy (08/18/13). Past treatment for this problem has included intra-articular injection of corticosteroids (as well as Synvisc) and opioid analgesics. Risk factors include total knee replacement (on the right by Dr. Wyline MoodWeller in [facility]). Sean Miles has had problems with this knee for quite a while now, must worse since October or November of last year. He had an arthroscopy done by Dr. [name] and ended up having quite a bit of arthritis, especially patellofemoral and medial. He did not improve after the arthroscopy. He subsequently has had cortisone and viscosupplement injections without benefit. The knee is hurting at all times. It is limiting what he can and cannot do.  He has previously had his right knee replaced by Dr. Wyline MoodWeller and he has done very well with that.  He would now like to proceed with the left knee surgery. Risks and benefits of the surgery have been discussed with the patient and they elect to proceed with surgery.  There are on active contraindications to upcoming procedure such as ongoing infection or progressive neurological disease.   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 mother Heart disease in male family member before age 55 Congestive Heart Failure mother Severe allergy sister Cerebrovascular Accident First Degree Relatives. father Chronic Obstructive Lung Disease mother Diabetes Mellitus father  Social History  Living situation live with spouse Marital status married Number of flights of stairs before winded 2-3 Alcohol use Moderate alcohol use. current drinker; drinks beer; 8-14 per week Tobacco use Never smoker. Drug/Alcohol Rehab (Previously) no Exercise Exercises rarely; does running / walking and individual sport Tobacco / smoke exposure no Drug/Alcohol Rehab (Currently) no Current work status working full time Illicit drug use no Children 2 Post-Surgical Plans Home Advance Directives Living Will, Healthcare POA  Medication History  Norco (5-325MG  Tablet, 1-2 Oral every 4-6 hours prn pain, Taken starting 04/28/2014) Active. Diclofenac Sodium (75MG  Tablet, Oral two times daily) Active. Fenofibrate (200MG  Capsule, 1 Oral daily) Active. Pravastatin Sodium (80MG  Tablet, 1 Oral daily) Active. Xanax (0.5MG  Tablet, 1-2 Oral as needed) Active. Folic Acid (1MG  Tablet, Oral) Active. AmLODIPine Besylate (5MG  Tablet, 1 Oral daily) Active. Benazepril-Hydrochlorothiazide (20-12.5MG  Tablet, 1 Oral two times daily) Active. Armour Thyroid  (30MG  Tablet, Oral) Active. Methotrexate (2.5MG  Tablet, 6 Oral once weekly) Active. Henderson [place]tezla (30MG   Tablet, Oral) Active.  Past Surgical History Total Knee Replacement right Rotator Cuff Repair left Back Surgery XLIF S/P Knee Arthroscopy (V58.43  Z48.89) left   Review of Systems General Not Present- Chills, Fatigue, Fever, Memory Loss, Night Sweats, Weight Gain and Weight Loss. Skin Not Present- Eczema, Hives, Itching, Lesions and Rash. HEENT Not Present- Dentures, Double Vision, Headache, Hearing Loss, Tinnitus and Visual Loss. Respiratory Not Present- Allergies, Chronic Cough, Coughing up blood, Shortness of breath at rest and Shortness of breath with exertion. Cardiovascular Not Present- Chest Pain, Difficulty Breathing Lying Down, Murmur, Palpitations, Racing/skipping heartbeats and Swelling. Gastrointestinal Not Present- Abdominal Pain, Bloody Stool, Constipation, Diarrhea, Difficulty Swallowing, Heartburn, Jaundice, Loss of appetitie, Nausea and Vomiting. Male Genitourinary Not Present- Blood in Urine, Discharge, Flank Pain, Incontinence, Painful Urination, Urgency, Urinary frequency, Urinary Retention, Urinating at Night and Weak urinary stream. Musculoskeletal Present- Joint Pain, Joint Swelling and Morning Stiffness. Not Present- Back Pain, Muscle Pain, Muscle Weakness and Spasms. Neurological Not Present- Blackout spells, Difficulty with balance, Dizziness, Paralysis, Tremor and Weakness. Psychiatric Not Present- Insomnia.   Vitals Weight: 290 lb Height: 70in Weight was reported by patient. Height was reported by patient. Body Surface Area: 2.55 m Body Mass Index: 41.61 kg/m BP: 138/72 (Sitting, Right Arm, Standard)    Physical Exam  General Mental Status -Alert, cooperative and good historian. General Appearance-pleasant, Not in acute distress. Orientation-Oriented X3. Build & Nutrition-Well nourished and Well developed.  Head and  Neck Head-normocephalic, atraumatic . Neck Global Assessment - supple, no bruit auscultated on the right, no bruit auscultated on the left.  Eye Pupil - Bilateral-Regular and Round. Motion - Bilateral-EOMI.  Chest and Lung Exam Auscultation Breath sounds - clear at anterior chest wall and clear at posterior chest wall. Adventitious sounds - No Adventitious sounds.  Cardiovascular Auscultation Rhythm - Regular rate and rhythm. Heart Sounds - S1 WNL and S2 WNL. Murmurs & Other Heart Sounds - Auscultation of the heart reveals - No Murmurs.  Abdomen Palpation/Percussion Tenderness - Abdomen is non-tender to palpation. Rigidity (guarding) - Abdomen is soft. Auscultation Auscultation of the abdomen reveals - Bowel sounds normal.  Male Genitourinary Note: Not done, not pertinent to present illness   Musculoskeletal Note: On exam he is a well developed male alert and oriented in no apparent distress. Evaluation of his right knee shows a well healed scar. Range of motion 0-125 degrees with no tenderness or instability.  Left knee with slight effusion. Range of motion about 5-125 degrees. Marked crepitus on range of motion. Significant tenderness medial and lateral with about a 5-mm opening on valgus stressing.  RADIOGRAPHS Radiographs are reviewed. Plain films from October show he had bone-on-bone arthritis in the patellofemoral compartment. The medial compartment was only slightly narrow. Previous scope pictures and he had a complete bone-on-bone patellofemoral compartment and a large area of exposed bone of the medial femoral condyle.   Assessment & Plan  Primary osteoarthritis of left knee (715.16  M17.12)  Note:Plan is for a Left Total Knee Replacement by Dr. Lequita HaltAluisio.  Plan is to go home.  PCP - Dr. Sigmund HazelLisa Miller  Please note that the patient has gotten nauseated with anesthesia in the past.  The patient does not have any contraindications and will receive TXA  (tranexamic acid) prior to surgery.  Signed electronically by Lauraine RinneAlexzandrew L Kamika Goodloe, III PA-C

## 2014-06-08 NOTE — Transfer of Care (Signed)
Immediate Anesthesia Transfer of Care Note  Patient: Sean Miles  Procedure(s) Performed: Procedure(s): LEFT TOTAL KNEE ARTHROPLASTY (Left)  Patient Location: PACU  Anesthesia Type:General  Level of Consciousness: awake, alert , oriented and patient cooperative  Airway & Oxygen Therapy: Patient Spontanous Breathing and Patient connected to face mask oxygen  Post-op Assessment: Report given to PACU RN, Post -op Vital signs reviewed and stable and Patient moving all extremities X 4  Post vital signs: stable  Complications: No apparent anesthesia complications

## 2014-06-08 NOTE — Progress Notes (Signed)
Pt placed on CPAP QHS via our machine and his tubing and nasal mask.  Humidification full and turned on, and the machine is plugged into a red outlet.

## 2014-06-08 NOTE — Plan of Care (Signed)
Problem: Phase I Progression Outcomes Goal: Pain controlled with appropriate interventions Outcome: Progressing Discussed pharmacological and nonpharmacological pain management. Reviewed prn pain meds.

## 2014-06-08 NOTE — Anesthesia Postprocedure Evaluation (Signed)
  Anesthesia Post-op Note  Patient: Sean Miles  Procedure(s) Performed: Procedure(s) (LRB): LEFT TOTAL KNEE ARTHROPLASTY (Left)  Patient Location: PACU  Anesthesia Type: General  Level of Consciousness: awake and alert   Airway and Oxygen Therapy: Patient Spontanous Breathing  Post-op Pain: mild  Post-op Assessment: Post-op Vital signs reviewed, Patient's Cardiovascular Status Stable, Respiratory Function Stable, Patent Airway and No signs of Nausea or vomiting  Last Vitals:  Filed Vitals:   06/08/14 1332  BP: 162/81  Pulse: 83  Temp:   Resp: 14    Post-op Vital Signs: stable   Complications: No apparent anesthesia complications

## 2014-06-08 NOTE — Op Note (Signed)
Pre-operative diagnosis- Osteoarthritis  Left knee(s)  Post-operative diagnosis- Osteoarthritis Left knee(s)  Procedure-  Left  Total Knee Arthroplasty (Attune system)  Surgeon- Callie Facey V. Adileny Delon, MD  Assistant- Amber Constable, PA-C   Anesthesia-  Spinal  EBL-* No blood loss amount entered *   Drains Hemovac  Tourniquet time- 36 minutes @ 300 mm Hg    Complications- None  Condition-PACU - hemodynamically stable.   Brief Clinical Note    Sean Miles is a 53 y.o. year old male with end stage OA of his left knee with progressively worsening pain and dysfunction. He has constant pain, with activity and at rest and significant functional deficits with difficulties even with ADLs. He has had extensive non-op management including analgesics, injections of cortisone and viscosupplements, and home exercise program, but remains in significant pain with significant dysfunction. Radiographs show bone on bone arthritis patellofemoral. He presents now for left Total Knee Arthroplasty.    Procedure in detail---   The patient is brought into the operating room and positioned supine on the operating table. After successful administration of  Miles,   a tourniquet is placed high on the  Left thigh(s) and the lower extremity is prepped and draped in the usual sterile fashion. Time out is performed by the operating team and then the  Left lower extremity is wrapped in Esmarch, knee flexed and the tourniquet inflated to 300 mmHg.       A midline incision is made with a ten blade through the subcutaneous tissue to the level of the extensor mechanism. A fresh blade is used to make a medial parapatellar arthrotomy. Soft tissue over the proximal medial tibia is subperiosteally elevated to the joint line with a knife and into the semimembranosus bursa with a Cobb elevator. Soft tissue over the proximal lateral tibia is elevated with attention being paid to avoiding the patellar tendon on the tibial tubercle.  The patella is everted, knee flexed 90 degrees and the ACL and PCL are removed. Findings are bone on bone medial and patellofemoral with massive global osteophytes.        The drill is used to create a starting hole in the distal femur and the canal is thoroughly irrigated with sterile saline to remove the fatty contents. The 5 degree Left  valgus alignment guide is placed into the femoral canal and the distal femoral cutting block is pinned to remove 10 mm off the distal femur. Resection is made with an oscillating saw.      The tibia is subluxed forward and the menisci are removed. The extramedullary alignment guide is placed referencing proximally at the medial aspect of the tibial tubercle and distally along the second metatarsal axis and tibial crest. The block is pinned to remove 43mSt Anthonys HospitalmEgnm LLC Dba LeNew JerseLTowSerit42maSelect [facility] [place] EnNew JerseSerit30maU[facility] WesNew JerseLSerit75maMir[facility] LP Memorialcare SaddlebNew JerseLCraSerit40maSt [facility] AlliNew JerseLCoSerit46maDel[facility] RehabilitaNew JerseLSerit75ma[facility] New JerseLPSerit59maWell[facility] P[facility] Surgical Eye CenNew JersSerit84maSelect [facility]ty [facility] The MedicaNew JerseSerit55maTexas [facility] HNew JerseSerit28maWashington [facility] Legacy EmanNew JerseLHudsSerit63maWhite Mountain Regional Medical CentNew JerseSerit21maNewsom Surgery Center [facility] CaribbNew JersSerit29maHealth [facility] New JerseLSSerita Grammesuater LLCficient medial  side. Resection is made with an oscillating saw. Size 7is the most appropriate size for the tibia and the proximal tibia is prepared with the modular drill and keel punch for that size.      The femoral sizing guide is placed and size 7 is most appropriate. Rotation is marked off the epicondylar axis and confirmed by creating a rectangular flexion gap at 90 degrees. The size 7 cutting block is pinned in this rotation and the anterior, posterior and chamfer cuts are made with the oscillating saw. The intercondylar block is then placed and that cut is made.      Trial size 7 tibial component, trial size 7 posterior stabilized femur and a 7  mm posterior stabilized rotating platform insert trial is placed. Full extension is achieved with excellent varus/valgus and anterior/posterior balance throughout full range of motion. The patella is everted and thickness measured to be 25  mm. Free hand resection is taken to `15 mm, a 38 template is placed, lug holes are drilled, trial patella is placed, and it tracks normally. Osteophytes are removed off the posterior femur with the trial in place. All trials  are removed and the cut bone surfaces prepared with pulsatile lavage. Cement is mixed and once ready for implantation, the size 7 tibial implant, size  7 posterior stabilized femoral component, and the size 38 patella are cemented in place and the patella is held with the clamp. The trial insert is placed and the knee held in full extension. The Exparel (20 ml mixed with 30 ml saline) and .25% Bupivicaine, are injected into the extensor mechanism, posterior capsule, medial and lateral gutters and subcutaneous tissues.  All extruded cement is removed and once the cement is hard the permanent 7 mm posterior stabilized rotating platform insert is placed into the tibial tray.      The wound is copiously irrigated with saline solution and the extensor mechanism closed over a hemovac drain with #1 V-loc suture. The tourniquet is released for a total tourniquet time of 36  minutes. Flexion against gravity is 140 degrees and the patella tracks normally. Subcutaneous tissue is closed with 2.0 vicryl and subcuticular with running 4.0 Monocryl. The incision is cleaned and dried and steri-strips and a bulky sterile dressing are applied. The limb is placed into a knee immobilizer and the patient is awakened and transported to recovery in stable condition.      Please note that a surgical assistant was a medical necessity for this procedure in order to perform it in a safe and expeditious manner. Surgical assistant was necessary to retract the ligaments and vital neurovascular structures to prevent injury to them and also necessary for proper positioning of the limb to allow for anatomic placement of the prosthesis.   Gus RankinFrank V. Hatsue Sime, MD    06/08/2014, 12:40 PM

## 2014-06-08 NOTE — Interval H&P Note (Signed)
History and Physical Interval Note:  06/08/2014 9:30 AM  Sean Miles  has presented today for surgery, with the diagnosis of left knee osteoarthritis  The various methods of treatment have been discussed with the patient and family. After consideration of risks, benefits and other options for treatment, the patient has consented to  Procedure(s): LEFT TOTAL KNEE ARTHROPLASTY (Left) as a surgical intervention .  The patient's history has been reviewed, patient examined, no change in status, stable for surgery.  I have reviewed the patient's chart and labs.  Questions were answered to the patient's satisfaction.     Loanne DrillingALUISIO,Sharvi Mooneyhan V

## 2014-06-09 LAB — BASIC METABOLIC PANEL
Anion gap: 12 (ref 5–15)
BUN: 10 mg/dL (ref 6–23)
CO2: 27 mEq/L (ref 19–32)
CREATININE: 0.75 mg/dL (ref 0.50–1.35)
Calcium: 8.9 mg/dL (ref 8.4–10.5)
Chloride: 98 mEq/L (ref 96–112)
GLUCOSE: 151 mg/dL — AB (ref 70–99)
Potassium: 4.7 mEq/L (ref 3.7–5.3)
Sodium: 137 mEq/L (ref 137–147)

## 2014-06-09 LAB — CBC
HEMATOCRIT: 31.5 % — AB (ref 39.0–52.0)
HEMOGLOBIN: 10.8 g/dL — AB (ref 13.0–17.0)
MCH: 29.4 pg (ref 26.0–34.0)
MCHC: 34.3 g/dL (ref 30.0–36.0)
MCV: 85.8 fL (ref 78.0–100.0)
Platelets: 248 10*3/uL (ref 150–400)
RBC: 3.67 MIL/uL — ABNORMAL LOW (ref 4.22–5.81)
RDW: 12.7 % (ref 11.5–15.5)
WBC: 13.6 10*3/uL — ABNORMAL HIGH (ref 4.0–10.5)

## 2014-06-09 MED ORDER — TRAMADOL HCL 50 MG PO TABS: 50.0000 mg | ORAL_TABLET | Freq: Four times a day (QID) | ORAL | Status: DC | PRN

## 2014-06-09 MED ORDER — OXYCODONE HCL 5 MG PO TABS: 5.0000 mg | ORAL_TABLET | ORAL | Status: DC | PRN

## 2014-06-09 MED ORDER — HYDROCHLOROTHIAZIDE 12.5 MG PO CAPS
12.5000 mg | ORAL_CAPSULE | Freq: Two times a day (BID) | ORAL | Status: DC
  Administered 2014-06-09 – 2014-06-10 (×3): 12.5 mg via ORAL
  Filled 2014-06-09 (×5): qty 1

## 2014-06-09 MED ORDER — OXYCODONE HCL 5 MG PO TABS
5.0000 mg | ORAL_TABLET | ORAL | Status: DC | PRN
  Administered 2014-06-09 – 2014-06-10 (×7): 20 mg via ORAL
  Filled 2014-06-09 (×7): qty 4

## 2014-06-09 MED ORDER — RIVAROXABAN 10 MG PO TABS: 10.0000 mg | ORAL_TABLET | Freq: Every day | ORAL | Status: DC

## 2014-06-09 MED ORDER — METHOCARBAMOL 500 MG PO TABS: 500.0000 mg | ORAL_TABLET | Freq: Four times a day (QID) | ORAL | Status: DC | PRN

## 2014-06-09 MED ORDER — BENAZEPRIL HCL 20 MG PO TABS
20.0000 mg | ORAL_TABLET | Freq: Two times a day (BID) | ORAL | Status: DC
  Administered 2014-06-09 – 2014-06-10 (×3): 20 mg via ORAL
  Filled 2014-06-09 (×5): qty 1

## 2014-06-09 MED ORDER — BENAZEPRIL-HYDROCHLOROTHIAZIDE 20-12.5 MG PO TABS: 1.0000 | ORAL_TABLET | Freq: Two times a day (BID) | ORAL | Status: DC

## 2014-06-09 NOTE — Discharge Summary (Signed)
Physician Discharge Summary   Patient ID: Sean Miles MRN: [id number] DOB/AGE: May 07, 1961 53 y.o.  Admit date: 06/08/2014 Discharge date: 06/10/2014  Primary Diagnosis:  Osteoarthritis Left knee(s) Admission Diagnoses:  Past Medical History  Diagnosis Date  . Complication of anesthesia     post anesthesia low sat rates  . Hypertension     followed by Dr. Sabra Heck (primary)  . Pneumonia   . Hypothyroidism     takes armour thyroid  . Arthritis     psoriatic  . Hypercholesteremia   . Anxiety     takes xanax 1- 2 times per day  . Sleep apnea     approx. 3 years ago, unsure of where it was done US[facility] PAP  . Psoriatic arthritis    Discharge Diagnoses:   Principal Problem:   OA (osteoarthritis) of knee  Estimated body mass index is 41.04 kg/(m^2) as calculated from the following:   Height as of this encounter: 5' 10" (1.778 m).   Weight as of this encounter: 129.729 kg (286 lb).  Procedure:  Procedure(s) (LRB): LEFT TOTAL KNEE ARTHROPLASTY (Left)   Consults: None  HPI: Sean Miles is a 53 y.o. year old male with end stage OA of his left knee with progressively worsening pain and dysfunction. He has constant pain, with activity and at rest and significant functional deficits with difficulties even with ADLs. He has had extensive non-op management including analgesics, injections of cortisone and viscosupplements, and home exercise program, but remains in significant pain with significant dysfunction. Radiographs show bone on bone arthritis patellofemoral. He presents now for left Total Knee Arthroplasty.   Laboratory Data: Admission on 06/08/2014, Discharged on 06/10/2014  Component Date Value Ref Range Status  . Glucose-Capillary 06/08/2014 117* 70 - 99 mg/dL Final  . WBC 06/09/2014 13.6* 4.0 - 10.5 K/uL Final  . RBC 06/09/2014 3.67* 4.22 - 5.81 MIL/uL Final  . Hemoglobin 06/09/2014 10.8* 13.0 - 17.0 g/dL Final  . HCT 06/09/2014 31.5* 39.0 - 52.0 % Final  . MCV  06/09/2014 85.8  78.0 - 100.0 fL Final  . MCH 06/09/2014 29.4  26.0 - 34.0 pg Final  . MCHC 06/09/2014 34.3  30.0 - 36.0 g/dL Final  . RDW 06/09/2014 12.7  11.5 - 15.5 % Final  . Platelets 06/09/2014 248  150 - 400 K/uL Final  . Sodium 06/09/2014 137  137 - 147 mEq/L Final  . Potassium 06/09/2014 4.7  3.7 - 5.3 mEq/L Final  . Chloride 06/09/2014 98  96 - 112 mEq/L Final  . CO2 06/09/2014 27  19 - 32 mEq/L Final  . Glucose, Bld 06/09/2014 151* 70 - 99 mg/dL Final  . BUN 06/09/2014 10  6 - 23 mg/dL Final  . Creatinine, Ser 06/09/2014 0.75  0.50 - 1.35 mg/dL Final  . Calcium 06/09/2014 8.9  8.4 - 10.5 mg/dL Final  . GFR calc non Af Amer 06/09/2014 >90  >90 mL/min Final  . GFR calc Af Amer 06/09/2014 >90  >90 mL/min Final   Comment: (NOTE)                          The eGFR has been calculated using the CKD EPI equation.                          This calculation has not been validated in all clinical situations.  eGFR's persistently <90 mL/min signify possible Chronic Kidney                          Disease.  . Anion gap 06/09/2014 12  5 - 15 Final  . WBC 06/10/2014 13.5* 4.0 - 10.5 K/uL Final  . RBC 06/10/2014 3.59* 4.22 - 5.81 MIL/uL Final  . Hemoglobin 06/10/2014 10.5* 13.0 - 17.0 g/dL Final  . HCT 06/10/2014 31.4* 39.0 - 52.0 % Final  . MCV 06/10/2014 87.5  78.0 - 100.0 fL Final  . MCH 06/10/2014 29.2  26.0 - 34.0 pg Final  . MCHC 06/10/2014 33.4  30.0 - 36.0 g/dL Final  . RDW 06/10/2014 12.7  11.5 - 15.5 % Final  . Platelets 06/10/2014 268  150 - 400 K/uL Final  . Sodium 06/10/2014 137  137 - 147 mEq/L Final  . Potassium 06/10/2014 4.2  3.7 - 5.3 mEq/L Final  . Chloride 06/10/2014 96  96 - 112 mEq/L Final  . CO2 06/10/2014 28  19 - 32 mEq/L Final  . Glucose, Bld 06/10/2014 125* 70 - 99 mg/dL Final  . BUN 06/10/2014 9  6 - 23 mg/dL Final  . Creatinine, Ser 06/10/2014 0.68  0.50 - 1.35 mg/dL Final  . Calcium 06/10/2014 9.2  8.4 - 10.5 mg/dL Final  . GFR  calc non Af Amer 06/10/2014 >90  >90 mL/min Final  . GFR calc Af Amer 06/10/2014 >90  >90 mL/min Final   Comment: (NOTE)                          The eGFR has been calculated using the CKD EPI equation.                          This calculation has not been validated in all clinical situations.                          eGFR's persistently <90 mL/min signify possible Chronic Kidney                          Disease.  Georgiann Hahn gap 06/10/2014 13  5 - 15 Final  Hospital Outpatient Visit on 06/01/2014  Component Date Value Ref Range Status  . MRSA, PCR 06/01/2014 NEGATIVE  NEGATIVE Final  . Staphylococcus aureus 06/01/2014 NEGATIVE  NEGATIVE Final   Comment:                                 The Xpert SA Assay (FDA                          approved for NASAL specimens                          in patients over 45 years of age),                          is one component of                          a comprehensive surveillance  program.  Test performance has                          been validated by [facility] for patients greater                          than or equal to 6 year old.                          It is not intended                          to diagnose infection nor to                          guide or monitor treatment.  [name] aPTT 06/01/2014 28  24 - 37 seconds Final  . WBC 06/01/2014 11.3* 4.0 - 10.5 K/uL Final  . RBC 06/01/2014 4.34  4.22 - 5.81 MIL/uL Final  . Hemoglobin 06/01/2014 12.7* 13.0 - 17.0 g/dL Final  . HCT 06/01/2014 38.6* 39.0 - 52.0 % Final  . MCV 06/01/2014 88.9  78.0 - 100.0 fL Final  . MCH 06/01/2014 29.3  26.0 - 34.0 pg Final  . MCHC 06/01/2014 32.9  30.0 - 36.0 g/dL Final  . RDW 06/01/2014 12.7  11.5 - 15.5 % Final  . Platelets 06/01/2014 312  150 - 400 K/uL Final  . Sodium 06/01/2014 138  137 - 147 mEq/L Final  . Potassium 06/01/2014 4.7  3.7 - 5.3 mEq/L Final  . Chloride 06/01/2014 97  96 - 112 mEq/L Final    . CO2 06/01/2014 26  19 - 32 mEq/L Final  . Glucose, Bld 06/01/2014 97  70 - 99 mg/dL Final  . BUN 06/01/2014 18  6 - 23 mg/dL Final  . Creatinine, Ser 06/01/2014 0.85  0.50 - 1.35 mg/dL Final  . Calcium 06/01/2014 9.7  8.4 - 10.5 mg/dL Final  . Total Protein 06/01/2014 7.4  6.0 - 8.3 g/dL Final  . Albumin 06/01/2014 4.2  3.5 - 5.2 g/dL Final  . AST 06/01/2014 29  0 - 37 U/L Final   Comment: SLIGHT HEMOLYSIS                          HEMOLYSIS AT THIS LEVEL MAY AFFECT RESULT  . ALT 06/01/2014 30  0 - 53 U/L Final  . Alkaline Phosphatase 06/01/2014 67  39 - 117 U/L Final  . Total Bilirubin 06/01/2014 0.3  0.3 - 1.2 mg/dL Final  . GFR calc non Af Amer 06/01/2014 >90  >90 mL/min Final  . GFR calc Af Amer 06/01/2014 >90  >90 mL/min Final   Comment: (NOTE)                          The eGFR has been calculated using the CKD EPI equation.                          This calculation has not been validated in all clinical situations.  eGFR's persistently <90 mL/min signify possible Chronic Kidney                          Disease.  . Anion gap 06/01/2014 15  5 - 15 Final  . Prothrombin Time 06/01/2014 13.4  11.6 - 15.2 seconds Final  . INR 06/01/2014 1.01  0.00 - 1.49 Final  . Color, Urine 06/01/2014 YELLOW  YELLOW Final  . APPearance 06/01/2014 CLOUDY* CLEAR Final  . Specific Gravity, Urine 06/01/2014 1.010  1.005 - 1.030 Final  . pH 06/01/2014 5.5  5.0 - 8.0 Final  . Glucose, UA 06/01/2014 NEGATIVE  NEGATIVE mg/dL Final  . Hgb urine dipstick 06/01/2014 NEGATIVE  NEGATIVE Final  . Bilirubin Urine 06/01/2014 NEGATIVE  NEGATIVE Final  . Ketones, ur 06/01/2014 NEGATIVE  NEGATIVE mg/dL Final  . Protein, ur 06/01/2014 NEGATIVE  NEGATIVE mg/dL Final  . Urobilinogen, UA 06/01/2014 0.2  0.0 - 1.0 mg/dL Final  . Nitrite 06/01/2014 NEGATIVE  NEGATIVE Final  . Leukocytes, UA 06/01/2014 NEGATIVE  NEGATIVE Final   MICROSCOPIC NOT DONE ON URINES WITH NEGATIVE PROTEIN, BLOOD,  LEUKOCYTES, NITRITE, OR GLUCOSE <1000 mg/dL.  . ABO/RH(D) 06/01/2014 O POS   Final  . Antibody Screen 06/01/2014 NEG   Final  . Sample Expiration 06/01/2014 06/11/2014   Final  . ABO/RH(D) 06/01/2014 O POS   Final     X-Rays:Dg Chest 2 View  06/01/2014   CLINICAL DATA:  Preop for left total knee replacement  EXAM: CHEST  2 VIEW  COMPARISON:  07/11/2011  FINDINGS: The heart size and mediastinal contours are within normal limits. Both lungs are clear. The visualized skeletal structures are unremarkable.  IMPRESSION: No active cardiopulmonary disease.   Electronically Signed   By: Lahoma Crocker M.D.   On: 06/01/2014 17:07    EKG: Orders placed or performed during the hospital encounter of 07/19/11  . EKG 12-Lead  . EKG 12-Lead  . EKG 12-Lead  . EKG 12-Lead  . EKG     Hospital Course: Sean Miles is a 53 y.o. who was admitted to [facility]. They were brought to the operating room on 06/08/2014 and underwent Procedure(s): LEFT TOTAL KNEE ARTHROPLASTY.  Patient tolerated the procedure well and was later transferred to the recovery room and then to the orthopaedic floor for postoperative care.  They were given PO and IV analgesics for pain control following their surgery.  They were given 24 hours of postoperative antibiotics of  Anti-infectives    Start     Dose/Rate Route Frequency Ordered Stop   06/08/14 1800  ceFAZolin (ANCEF) IVPB 2 g/50 mL premix     2 g100 mL/hr over 30 Minutes Intravenous Every 6 hours 06/08/14 1521 06/09/14 0030   06/08/14 0834  ceFAZolin (ANCEF) 3 g in dextrose 5 % 50 mL IVPB     3 g160 mL/hr over 30 Minutes Intravenous On call to O.R. 06/08/14 7253 06/08/14 1132     and started on DVT prophylaxis in the form of Xarelto.   PT and OT were ordered for total joint protocol.  Discharge planning consulted to help with postop disposition and equipment needs.  Patient had a very tough night on the evening of surgery.  Patient was having problems with pain in the  knee, requiring pain medications.  Started therapy. Patient had been on chronic Vicodin mediation. Increased the dose of Oxy IR at that time.They started to get up OOB with therapy on day one.  Hemovac drain was pulled without difficulty.  Continued to work with therapy into day two.  Dressing was changed on day two and the incision was healing well.  Patient was seen in rounds and was ready to go home.  Discharge home with home health Diet - Cardiac diet Follow up - in 2 weeks Activity - WBAT Disposition - Home Condition Upon Discharge - Good D/C Meds - See DC Summary DVT Prophylaxis - Xarelto      Discharge Instructions    Call MD / Call 911    Complete by:  As directed   If you experience chest pain or shortness of breath, CALL 911 and be transported to the hospital emergency room.  If you develope a fever above 101 F, pus (white drainage) or increased drainage or redness at the wound, or calf pain, call your surgeon's office.     Change dressing    Complete by:  As directed   Change dressing daily with sterile 4 x 4 inch gauze dressing and apply TED hose. Do not submerge the incision under water.     Constipation Prevention    Complete by:  As directed   Drink plenty of fluids.  Prune juice may be helpful.  You may use a stool softener, such as Colace (over the counter) 100 mg twice a day.  Use MiraLax (over the counter) for constipation as needed.     Diet - low sodium heart healthy    Complete by:  As directed      Diet Carb Modified    Complete by:  As directed      Discharge instructions    Complete by:  As directed   Pick up stool softner and laxative for home. Do not submerge incision under water. May shower. Continue to use ice for pain and swelling from surgery.  Take Xarelto for two and a half more weeks, then discontinue Xarelto. Once the patient has completed the blood thinner regimen, then take a Baby 81 mg Aspirin daily for three more weeks.     Do not put a  pillow under the knee. Place it under the heel.    Complete by:  As directed      Do not sit on low chairs, stoools or toilet seats, as it may be difficult to get up from low surfaces    Complete by:  As directed      Driving restrictions    Complete by:  As directed   No driving until released by the physician.     Increase activity slowly as tolerated    Complete by:  As directed      Lifting restrictions    Complete by:  As directed   No lifting until released by the physician.     Patient may shower    Complete by:  As directed   You may shower without a dressing once there is no drainage.  Do not wash over the wound.  If drainage remains, do not shower until drainage stops.     TED hose    Complete by:  As directed   Use stockings (TED hose) for 3 weeks on both leg(s).  You may remove them at night for sleeping.     Weight bearing as tolerated    Complete by:  As directed   Laterality:  left  Extremity:  Lower            Medication List    STOP taking these  medications        co-enzyme Q-10 30 MG capsule     diclofenac 75 MG EC tablet  Commonly known as:  VOLTAREN     folic acid 1 MG tablet  Commonly known as:  FOLVITE     Glucosamine HCl 1000 MG Tabs     HYDROcodone-acetaminophen 5-325 MG per tablet  Commonly known as:  NORCO/VICODIN     methotrexate 2.5 MG tablet  Commonly known as:  RHEUMATREX     multivitamins ther. w/minerals Tabs tablet     VITAMIN D PO      TAKE these medications        ALPRAZolam 0.5 MG tablet  Commonly known as:  XANAX  Take 0.5 mg by mouth at bedtime as needed for anxiety or sleep.     amLODipine 5 MG tablet  Commonly known as:  NORVASC  Take 5 mg by mouth at bedtime.     benazepril-hydrochlorthiazide 20-12.5 MG per tablet  Commonly known as:  LOTENSIN HCT  Take 1 tablet by mouth 2 (two) times daily.     fenofibrate 160 MG tablet  Take 160 mg by mouth at bedtime.     methocarbamol 500 MG tablet  Commonly known as:   ROBAXIN  Take 1 tablet (500 mg total) by mouth every 6 (six) hours as needed for muscle spasms.     OTEZLA 30 MG Tabs  Generic drug:  Apremilast  Take 1 tablet by mouth 2 (two) times daily.     oxyCODONE 5 MG immediate release tablet  Commonly known as:  Oxy IR/ROXICODONE  Take 1-4 tablets (5-20 mg total) by mouth every 3 (three) hours as needed for moderate pain, severe pain or breakthrough pain.     pravastatin 80 MG tablet  Commonly known as:  PRAVACHOL  Take 80 mg by mouth at bedtime.     rivaroxaban 10 MG Tabs tablet  Commonly known as:  XARELTO  - Take 1 tablet (10 mg total) by mouth daily with breakfast. Take Xarelto for two and a half more weeks, then discontinue Xarelto.  - Once the patient has completed the blood thinner regimen, then take a Baby 81 mg Aspirin daily for three more weeks.     thyroid 30 MG tablet  Commonly known as:  ARMOUR  Take 210 mg by mouth at bedtime.     traMADol 50 MG tablet  Commonly known as:  ULTRAM  Take 1-2 tablets (50-100 mg total) by mouth every 6 (six) hours as needed (mild pain).       Follow-up Information    Follow up with [facility].   Why:  home health physical therapy   Contact information:   [address] [place] [address] [phone]       Follow up with Gearlean Alf, MD. Schedule an appointment as soon as possible for a visit on 06/23/2014.   Specialty:  Orthopedic Surgery   Why:  Call [phone] tomorrow to make the appointment   Contact information:   [address] [place] [address] [phone]       Signed: Arlee Muslim, PA-C Orthopaedic Surgery 06/25/2014, 8:46 AM

## 2014-06-09 NOTE — Progress Notes (Signed)
Utilization review completed.  

## 2014-06-09 NOTE — Progress Notes (Signed)
   Subjective: 1 Day Post-Op Procedure(s) (LRB): LEFT TOTAL KNEE ARTHROPLASTY (Left) Patient reports pain as moderate and severe last night.  Little better this morning but still with pain. Patient seen in rounds with Dr. Lequita HaltAluisio. Patient is having problems with pain in the knee, requiring pain medications We will start therapy today. Patient has been on chronic Vicodin mediation.  Will increase the dose of Oxy IR at this time. Plan is to go Home after hospital stay.  Objective: Vital signs in last 24 hours: Temp:  [98.2 F (36.8 C)-99.2 F (37.3 C)] 98.4 F (36.9 C) (10/20 0438) Pulse Rate:  [80-117] 84 (10/20 0645) Resp:  [9-20] 18 (10/20 0645) BP: (153-179)/(63-96) 179/84 mmHg (10/20 0438) SpO2:  [94 %-100 %] 98 % (10/20 0645)  Intake/Output from previous day:  Intake/Output Summary (Last 24 hours) at 06/09/14 0902 Last data filed at 06/09/14 0645  Gross per 24 hour  Intake 5588.33 ml  Output   5740 ml  Net -151.67 ml    Intake/Output this shift: UOP 3350 since MN -151  Labs:  Recent Labs  06/09/14 0630  HGB 10.8*    Recent Labs  06/09/14 0630  WBC 13.6*  RBC 3.67*  HCT 31.5*  PLT 248    Recent Labs  06/09/14 0630  NA 137  K 4.7  CL 98  CO2 27  BUN 10  CREATININE 0.75  GLUCOSE 151*  CALCIUM 8.9   No results found for this basename: LABPT, INR,  in the last 72 hours  EXAM General - Patient is Alert, Appropriate and Oriented Extremity - Neurovascular intact Sensation intact distally Dorsiflexion/Plantar flexion intact Dressing - dressing C/D/I Motor Function - intact, moving foot and toes well on exam.  Hemovac pulled without difficulty.  Past Medical History  Diagnosis Date  . Complication of anesthesia     post anesthesia low sat rates  . Hypertension     followed by [name] (primary)  . Pneumonia   . Hypothyroidism     takes armour thyroid  . Arthritis     psoriatic  . Hypercholesteremia   . Anxiety     takes xanax 1- 2  times per day  . Sleep apnea     approx. 3 years ago, unsure of where it was done USES C PAP  . Psoriatic arthritis     Assessment/Plan: 1 Day Post-Op Procedure(s) (LRB): LEFT TOTAL KNEE ARTHROPLASTY (Left) Principal Problem:   OA (osteoarthritis) of knee  Estimated body mass index is 41.04 kg/(m^2) as calculated from the following:   Height as of this encounter: 5\' 10"  (1.778 m).   Weight as of this encounter: 129.729 kg (286 lb). Advance diet Up with therapy Plan for discharge tomorrow Discharge home with home health  DVT Prophylaxis - Xarelto Weight-Bearing as tolerated to left leg D/C O2 and Pulse OX and try on Room Air  Avel Peacerew Perkins, PA-C Orthopaedic Surgery 06/09/2014, 9:02 AM

## 2014-06-09 NOTE — Care Management Note (Signed)
    Page 1 of 2   06/09/2014     10:16:21 AM CARE MANAGEMENT NOTE 06/09/2014  Patient:  Sean Miles, Sean Miles   Account Number:  [id number]  Date Initiated:  06/09/2014  Documentation initiated by:  [facility]  Subjective/Objective Assessment:   adm: LEFT TOTAL KNEE ARTHROPLASTY (Left)     Action/Plan:   discharge planning   Anticipated DC Date:  06/10/2014   Anticipated DC Plan:  [place]  CM consult      [facility] Choice  HOME HEALTH   Choice offered to / List presented to:  C-1 Patient   DME arranged  NA      DME agency  NA     [place] arranged  HH-2 PT      [place]   Status of service:  Completed, signed off Medicare Important Message given?   (If response is "NO", the following Medicare IM given date fields will be blank) Date Medicare IM given:   Medicare IM given by:   Date Additional Medicare IM given:   Additional Medicare IM given by:    Discharge Disposition:  [place]  Per UR Regulation:    If discussed at Long Length of Stay Meetings, dates discussed:    Comments:  06/09/14 08:15 CM met with pt in room to offer choice of home health agency.  Pt chooses [facility] to render HHPT services.  Pt has both a rolling walker and 3n1 at home. Address and contact information verified with pt.  Referral called to [facility].  No other CM needs were communicated.  Mariane Masters, BSN, CM [phone].

## 2014-06-09 NOTE — Discharge Instructions (Addendum)
° °Dr. Frank Aluisio °Total Joint Specialist °[facility] °[address] °, [place] [address] °[phone] ° °TOTAL KNEE REPLACEMENT POSTOPERATIVE DIRECTIONS ° ° ° °Knee Rehabilitation, Guidelines Following Surgery  °Results after knee surgery are often greatly improved when you follow the exercise, range of motion and muscle strengthening exercises prescribed by your doctor. Safety measures are also important to protect the knee from further injury. Any time any of these exercises cause you to have increased pain or swelling in your knee joint, decrease the amount until you are comfortable again and slowly increase them. If you have problems or questions, call your caregiver or physical therapist for advice.  ° °HOME CARE INSTRUCTIONS  °Remove items at home which could result in a fall. This includes throw rugs or furniture in walking pathways.  °Continue medications as instructed at time of discharge. °You may have some home medications which will be placed on hold until you complete the course of blood thinner medication.  °You may start showering once you are discharged home but do not submerge the incision under water. Just pat the incision dry and apply a dry gauze dressing on daily. °Walk with walker as instructed.  °You may resume a sexual relationship in one month or when given the OK by  your doctor.  °· Use walker as long as suggested by your caregivers. °· Avoid periods of inactivity such as sitting longer than an hour when not asleep. This helps prevent blood clots.  °You may put full weight on your legs and walk as much as is comfortable.  °You may return to work once you are cleared by your doctor.  °Do not drive a car for 6 weeks or until released by you surgeon.  °· Do not drive while taking narcotics.  °Wear the elastic stockings for three weeks following surgery during the day but you may remove then at night. °Make sure you keep all of your appointments after your  operation with all of your doctors and caregivers. You should call the office at the above phone number and make an appointment for approximately two weeks after the date of your surgery. °Change the dressing daily and reapply a dry dressing each time. °Please pick up a stool softener and laxative for home use as long as you are requiring pain medications. °· Continue to use ice on the knee for pain and swelling from surgery. You may notice swelling that will progress down to the foot and ankle.  This is normal after surgery.  Elevate the leg when you are not up walking on it.   °It is important for you to complete the blood thinner medication as prescribed by your doctor. °· Continue to use the breathing machine which will help keep your temperature down.  It is common for your temperature to cycle up and down following surgery, especially at night when you are not up moving around and exerting yourself.  The breathing machine keeps your lungs expanded and your temperature down. ° °RANGE OF MOTION AND STRENGTHENING EXERCISES  °Rehabilitation of the knee is important following a knee injury or an operation. After just a few days of immobilization, the muscles of the thigh which control the knee become weakened and shrink (atrophy). Knee exercises are designed to build up the tone and strength of the thigh muscles and to improve knee motion. Often times heat used for twenty to thirty minutes before working out will loosen up your tissues and help with improving the   range of motion but do not use heat for the first two weeks following surgery. These exercises can be done on a training (exercise) mat, on the floor, on a table or on a bed. Use what ever works the best and is most comfortable for you Knee exercises include:  °Leg Lifts - While your knee is still immobilized in a splint or cast, you can do straight leg raises. Lift the leg to 60 degrees, hold for 3 sec, and slowly lower the leg. Repeat 10-20 times 2-3  times daily. Perform this exercise against resistance later as your knee gets better.  °Quad and Hamstring Sets - Tighten up the muscle on the front of the thigh (Quad) and hold for 5-10 sec. Repeat this 10-20 times hourly. Hamstring sets are done by pushing the foot backward against an object and holding for 5-10 sec. Repeat as with quad sets.  °A rehabilitation program following serious knee injuries can speed recovery and prevent re-injury in the future due to weakened muscles. Contact your doctor or a physical therapist for more information on knee rehabilitation.  ° °SKILLED REHAB INSTRUCTIONS: °If the patient is transferred to a skilled rehab facility following release from the hospital, a list of the current medications will be sent to the facility for the patient to continue.  When discharged from the skilled rehab facility, please have the facility set up the patient's Home Health Physical Therapy prior to being released. Also, the skilled facility will be responsible for providing the patient with their medications at time of release from the facility to include their pain medication, the muscle relaxants, and their blood thinner medication. If the patient is still at the rehab facility at time of the two week follow up appointment, the skilled rehab facility will also need to assist the patient in arranging follow up appointment in our office and any transportation needs. ° °MAKE SURE YOU:  °Understand these instructions.  °Will watch your condition.  °Will get help right away if you are not doing well or get worse.  ° ° °Pick up stool softner and laxative for home. °Do not submerge incision under water. °May shower. °Continue to use ice for pain and swelling from surgery. ° °Take Xarelto for two and a half more weeks, then discontinue Xarelto. °Once the patient has completed the blood thinner regimen, then take a Baby 81 mg Aspirin daily for three more weeks. ° °Information on my medicine - XARELTO®  (Rivaroxaban) ° °This medication education was reviewed with me or my healthcare representative as part of my discharge preparation.  The pharmacist that spoke with me during my hospital stay was:  Moran, Laura S, RPH ° °Why was Xarelto® prescribed for you? °Xarelto® was prescribed for you to reduce the risk of blood clots forming after orthopedic surgery. The medical term for these abnormal blood clots is venous thromboembolism (VTE). ° °What do you need to know about xarelto® ? °Take your Xarelto® ONCE DAILY at the same time every day. °You may take it either with or without food. ° °If you have difficulty swallowing the tablet whole, you may crush it and mix in applesauce just prior to taking your dose. ° °Take Xarelto® exactly as prescribed by your doctor and DO NOT stop taking Xarelto® without talking to the doctor who prescribed the medication.  Stopping without other VTE prevention medication to take the place of Xarelto® may increase your risk of developing a clot. ° °After discharge, you should have regular check-up appointments   with your healthcare provider that is prescribing your Xarelto®.   ° °What do you do if you miss a dose? °If you miss a dose, take it as soon as you remember on the same day then continue your regularly scheduled once daily regimen the next day. Do not take two doses of Xarelto® on the same day.  ° °Important Safety Information °A possible side effect of Xarelto® is bleeding. You should call your healthcare provider right away if you experience any of the following: °  Bleeding from an injury or your nose that does not stop. °  Unusual colored urine (red or dark brown) or unusual colored stools (red or black). °  Unusual bruising for unknown reasons. °  A serious fall or if you hit your head (even if there is no bleeding). ° °Some medicines may interact with Xarelto® and might increase your risk of bleeding while on Xarelto®. To help avoid this, consult your healthcare provider or  pharmacist prior to using any new prescription or non-prescription medications, including herbals, vitamins, non-steroidal anti-inflammatory drugs (NSAIDs) and supplements. ° °This website has more information on Xarelto®: www.xarelto.com. ° ° ° °

## 2014-06-09 NOTE — Plan of Care (Signed)
Problem: Phase II Progression Outcomes Goal: Discharge plan established Outcome: Completed/Met Date Met:  06/09/14 Home with [facility]

## 2014-06-09 NOTE — Evaluation (Signed)
Physical Therapy Evaluation Patient Details Name: Sean Miles MRN: [id number] DOB: 03-09-1961 Today's Date: 06/09/2014   History of Present Illness  LTKA  Clinical Impression  Pt. Experiencing  A lot of pain but pushed  On. Patient will benefit from PT to address  Problems listed .    Follow Up Recommendations Home health PT;Supervision/Assistance - 24 hour    Equipment Recommendations  Rolling walker with 5" wheels    Recommendations for Other Services       Precautions / Restrictions Precautions Precautions: Knee Required Braces or Orthoses: Knee Immobilizer - Left Knee Immobilizer - Left: Discontinue once straight leg raise with < 10 degree lag      Mobility  Bed Mobility Overal bed mobility: Needs Assistance Bed Mobility: Supine to Sit     Supine to sit: Mod assist     General bed mobility comments: cues for safety, assist LLE  Transfers Overall transfer level: Needs assistance Equipment used: Rolling walker (2 wheeled) Transfers: Sit to/from Stand Sit to Stand: Min assist         General transfer comment: cues for safety and for Hand and LLE position  Ambulation/Gait Ambulation/Gait assistance: Min assist Ambulation Distance (Feet): 60 Feet Assistive device: Rolling walker (2 wheeled) Gait Pattern/deviations: Step-to pattern;Decreased step length - left;Decreased stance time - left;Antalgic     General Gait Details: cues for posture and for sequence  Stairs            Wheelchair Mobility    Modified Rankin (Stroke Patients Only)       Balance Overall balance assessment: Needs assistance                                           Pertinent Vitals/Pain Pain Assessment: 0-10 Pain Score: 8  Pain Descriptors / Indicators: Discomfort;Aching;Constant Pain Intervention(s): Monitored during session;Repositioned;Ice applied;Patient requesting pain meds-RN notified    Home Living Family/patient expects to be discharged  to:: Private residence Living Arrangements: Spouse/significant other;Children Available Help at Discharge: Family Type of Home: House Home Access: Stairs to enter Entrance Stairs-Rails: Right Entrance Stairs-Number of Steps: 3 Home Layout: Two level        Prior Function Level of Independence: Independent               Hand Dominance        Extremity/Trunk Assessment   Upper Extremity Assessment: Overall WFL for tasks assessed           Lower Extremity Assessment: LLE deficits/detail   LLE Deficits / Details: requires assist for moving leg off of bed.  Cervical / Trunk Assessment: Normal  Communication   Communication: No difficulties  Cognition Arousal/Alertness: Awake/alert Behavior During Therapy: WFL for tasks assessed/performed Overall Cognitive Status: Within Functional Limits for tasks assessed                      General Comments      Exercises        Assessment/Plan    PT Assessment Patient needs continued PT services  PT Diagnosis Difficulty walking;Acute pain   PT Problem List Decreased strength;Decreased range of motion;Decreased activity tolerance;Decreased mobility;Decreased knowledge of use of DME;Decreased safety awareness;Decreased knowledge of precautions;Pain  PT Treatment Interventions DME instruction;Gait training;Stair training;Functional mobility training;Therapeutic activities;Patient/family education   PT Goals (Current goals can be found in the Care Plan section) Acute Rehab PT Goals  Patient Stated Goal: to walk without pain PT Goal Formulation: With patient Time For Goal Achievement: 06/13/14 Potential to Achieve Goals: Good    Frequency 7X/week   Barriers to discharge        Co-evaluation               End of Session Equipment Utilized During Treatment: Left knee immobilizer Activity Tolerance: Patient tolerated treatment well;Patient limited by pain Patient left: in chair;with call bell/phone  within reach Nurse Communication: Mobility status         Time: 9528-41321148-1205 PT Time Calculation (min): 17 min   Charges:   PT Evaluation $Initial PT Evaluation Tier I: 1 Procedure PT Treatments $Gait Training: 8-22 mins   PT G Codes:          Rada HayHill, Lelani Garnett Elizabeth 06/09/2014, 4:30 PM Blanchard KelchKaren Nikiah [facility] PT [phone]

## 2014-06-09 NOTE — Progress Notes (Signed)
OT Cancellation Note  Patient Details Name: Sean Miles MRN: [id number] DOB: [birth year]   Cancelled Treatment:    Reason Eval/Treat Not Completed: Other (comment) Pt just finishing with PT when OT checked by and having pain. Will check back later time.  Lennox LaityStone, Weylin Plagge Stafford [phone] 06/09/2014, 12:41 PM

## 2014-06-09 NOTE — Progress Notes (Signed)
Physical Therapy Treatment Patient Details Name: Sean Miles MRN: [id number] DOB: 05/18/1961 Today's Date: 06/09/2014    History of Present Illness LTKA    PT Comments    Pt's pain  Control improved. Increased tolerance to activity.  Follow Up Recommendations  Home health PT;Supervision/Assistance - 24 hour     Equipment Recommendations  Rolling walker with 5" wheels    Recommendations for Other Services       Precautions / Restrictions Precautions Precautions: Knee Required Braces or Orthoses: Knee Immobilizer - Left Knee Immobilizer - Left: Discontinue once straight leg raise with < 10 degree lag    Mobility  Bed Mobility Overal bed mobility: Needs Assistance Bed Mobility: Sit to Supine     Supine to sit: Mod assist Sit to supine: Min assist   General bed mobility comments: asssit for  LLE onto bed,  Transfers Overall transfer level: Needs assistance Equipment used: Rolling walker (2 wheeled) Transfers: Sit to/from Stand Sit to Stand: Min assist         General transfer comment: cues for safety and for Hand and LLE position  Ambulation/Gait Ambulation/Gait assistance: Min guard Ambulation Distance (Feet): 180 Feet Assistive device: Rolling walker (2 wheeled) Gait Pattern/deviations: Step-through pattern;Step-to pattern;Antalgic     General Gait Details: cues for posture and for sequence   Stairs            Wheelchair Mobility    Modified Rankin (Stroke Patients Only)       Balance Overall balance assessment: Needs assistance                                  Cognition Arousal/Alertness: Awake/alert Behavior During Therapy: WFL for tasks assessed/performed Overall Cognitive Status: Within Functional Limits for tasks assessed                      Exercises Total Joint Exercises Ankle Circles/Pumps: AROM;Left;Right;10 reps Quad Sets: AROM;Right;Left;10 reps Heel Slides: AAROM;Left;10 reps Hip  ABduction/ADduction: AAROM;Left;10 reps Straight Leg Raises: AAROM;Left;10 reps Goniometric ROM: 10-50 L knee flexion    General Comments        Pertinent Vitals/Pain Pain Assessment: 0-10 Pain Score: 5  Pain Descriptors / Indicators: Aching;Cramping Pain Intervention(s): Monitored during session;Premedicated before session;Repositioned;Ice applied    Home Living Family/patient expects to be discharged to:: Private residence Living Arrangements: Spouse/significant other;Children Available Help at Discharge: Family Type of Home: House Home Access: Stairs to enter Entrance Stairs-Rails: Right Home Layout: Two level        Prior Function Level of Independence: Independent          PT Goals (current goals can now be found in the care plan section) Acute Rehab PT Goals Patient Stated Goal: to walk without pain PT Goal Formulation: With patient Time For Goal Achievement: 06/13/14 Potential to Achieve Goals: Good Progress towards PT goals: Progressing toward goals    Frequency  7X/week    PT Plan Current plan remains appropriate    Co-evaluation             End of Session Equipment Utilized During Treatment: Left knee immobilizer Activity Tolerance: Patient tolerated treatment well;Patient limited by pain Patient left: in bed;with call bell/phone within reach     Time: 1535-1620 PT Time Calculation (min): 45 min  Charges:  $Gait Training: 23-37 mins $Therapeutic Exercise: 8-22 mins  G Codes:      Rada Miles, Sean Vink Elizabeth 06/09/2014, 4:37 PM Sean Miles PT [phone]

## 2014-06-10 LAB — CBC
HCT: 31.4 % — ABNORMAL LOW (ref 39.0–52.0)
HEMOGLOBIN: 10.5 g/dL — AB (ref 13.0–17.0)
MCH: 29.2 pg (ref 26.0–34.0)
MCHC: 33.4 g/dL (ref 30.0–36.0)
MCV: 87.5 fL (ref 78.0–100.0)
Platelets: 268 10*3/uL (ref 150–400)
RBC: 3.59 MIL/uL — ABNORMAL LOW (ref 4.22–5.81)
RDW: 12.7 % (ref 11.5–15.5)
WBC: 13.5 10*3/uL — ABNORMAL HIGH (ref 4.0–10.5)

## 2014-06-10 LAB — BASIC METABOLIC PANEL
Anion gap: 13 (ref 5–15)
BUN: 9 mg/dL (ref 6–23)
CALCIUM: 9.2 mg/dL (ref 8.4–10.5)
CO2: 28 mEq/L (ref 19–32)
Chloride: 96 mEq/L (ref 96–112)
Creatinine, Ser: 0.68 mg/dL (ref 0.50–1.35)
GFR calc Af Amer: >90 mL/min (ref >90)
GLUCOSE: 125 mg/dL — AB (ref 70–99)
Potassium: 4.2 mEq/L (ref 3.7–5.3)
SODIUM: 137 meq/L (ref 137–147)

## 2014-06-10 NOTE — Progress Notes (Signed)
   Subjective: 2 Days Post-Op Procedure(s) (LRB): LEFT TOTAL KNEE ARTHROPLASTY (Left) Patient reports pain as mild.   Patient seen in rounds with Dr. Lequita HaltAluisio. Patient is well, and has had no acute complaints or problems Patient is ready to go home  Objective: Vital signs in last 24 hours: Temp:  [98.1 F (36.7 C)-98.9 F (37.2 C)] 98.9 F (37.2 C) (10/21 0604) Pulse Rate:  [80-92] 92 (10/21 0604) Resp:  [16] 16 (10/21 0604) BP: (153-162)/(78-80) 154/80 mmHg (10/21 0604) SpO2:  [95 %-98 %] 97 % (10/21 0604)  Intake/Output from previous day:  Intake/Output Summary (Last 24 hours) at 06/10/14 0755 Last data filed at 06/10/14 [id number]  Gross per 24 hour  Intake   1440 ml  Output   2650 ml  Net  -1210 ml    Labs:  Recent Labs  06/09/14 0630 06/10/14 0430  HGB 10.8* 10.5*    Recent Labs  06/09/14 0630 06/10/14 0430  WBC 13.6* 13.5*  RBC 3.67* 3.59*  HCT 31.5* 31.4*  PLT 248 268    Recent Labs  06/09/14 0630 06/10/14 0430  NA 137 137  K 4.7 4.2  CL 98 96  CO2 27 28  BUN 10 9  CREATININE 0.75 0.68  GLUCOSE 151* 125*  CALCIUM 8.9 9.2   No results found for this basename: LABPT, INR,  in the last 72 hours  EXAM: General - Patient is Alert, Appropriate and Oriented Extremity - Neurovascular intact Sensation intact distally Dorsiflexion/Plantar flexion intact Incision - clean, dry, no drainage, healing Motor Function - intact, moving foot and toes well on exam.   Assessment/Plan: 2 Days Post-Op Procedure(s) (LRB): LEFT TOTAL KNEE ARTHROPLASTY (Left) Procedure(s) (LRB): LEFT TOTAL KNEE ARTHROPLASTY (Left) Past Medical History  Diagnosis Date  . Complication of anesthesia     post anesthesia low sat rates  . Hypertension     followed by [name] (primary)  . Pneumonia   . Hypothyroidism     takes armour thyroid  . Arthritis     psoriatic  . Hypercholesteremia   . Anxiety     takes xanax 1- 2 times per day  . Sleep apnea     approx. 3 years  ago, unsure of where it was done USES C PAP  . Psoriatic arthritis    Principal Problem:   OA (osteoarthritis) of knee  Estimated body mass index is 41.04 kg/(m^2) as calculated from the following:   Height as of this encounter: 5\' 10"  (1.778 m).   Weight as of this encounter: 129.729 kg (286 lb). Up with therapy Discharge home with home health Diet - Cardiac diet Follow up - in 2 weeks Activity - WBAT Disposition - Home Condition Upon Discharge - Good D/C Meds - See DC Summary DVT Prophylaxis - Xarelto  Avel Peacerew Perkins, PA-C Orthopaedic Surgery 06/10/2014, 7:55 AM

## 2014-06-10 NOTE — Clinical Documentation Improvement (Signed)
Presents with Osteoarthritis of left knee; TKR performed.  With ICD 10 in full swing, they are now asking for the type of Osteoarthritis.  Can you please clarify the type: Primary            Secondary            Erosive            Post Traumatic  Thank You, Shellee MiloEileen T Sherman Lipuma ,RN Clinical Documentation Specialist:  [phone]  [facility]- [facility] Information Management

## 2014-06-10 NOTE — Progress Notes (Signed)
OT Cancellation Note  Patient Details Name: Renold Gentaracy L Wyre MRN: [id number] DOB: 12-Dec-1960   Cancelled Treatment:    Reason Eval/Treat Not Completed: OT screened, no needs identified, will sign off.  Pt has had previous orthopedic surgeries and does not have any OT needs.  Mychaela Lennartz 06/10/2014, 9:24 AM Marica OtterMaryellen Tanairy Payeur, OTR/L [phone] 06/10/2014

## 2014-06-10 NOTE — Progress Notes (Signed)
Physical Therapy Treatment Patient Details Name: Sean Miles MRN: [id number] DOB: 1961-05-11 Today's Date: 06/10/2014    History of Present Illness LTKA    PT Comments    Patient tolerated very well today. Ready for DC.  Follow Up Recommendations  Home health PT;Supervision/Assistance - 24 hour     Equipment Recommendations  Rolling walker with 5" wheels    Recommendations for Other Services       Precautions / Restrictions Precautions Precautions: Knee Required Braces or Orthoses: Knee Immobilizer - Left    Mobility  Bed Mobility           Sit to supine: Min assist   General bed mobility comments: asssit for  LLE onto bed,  Transfers Overall transfer level: Needs assistance Equipment used: Rolling walker (2 wheeled) Transfers: Sit to/from Stand Sit to Stand: Supervision         General transfer comment: cues for safety and for Hand and LLE position  Ambulation/Gait Ambulation/Gait assistance: Supervision Ambulation Distance (Feet): 400 Feet Assistive device: Rolling walker (2 wheeled) Gait Pattern/deviations: Step-through pattern;Antalgic     General Gait Details: cues for posture and for sequence   Stairs Stairs: Yes Stairs assistance: Min assist Stair Management: Forwards;With crutches;One rail Right Number of Stairs: 4 General stair comments: cues for sequence and safety  Wheelchair Mobility    Modified Rankin (Stroke Patients Only)       Balance                                    Cognition Arousal/Alertness: Awake/alert                          Exercises Total Joint Exercises Ankle Circles/Pumps: AROM;Left;Right;10 reps;Supine Quad Sets: AROM;Right;Left;10 reps;Supine Short Arc Quad: AAROM;Left;10 reps;Supine Heel Slides: AAROM;Left;10 reps;Supine Hip ABduction/ADduction: AAROM;Left;10 reps;Supine Straight Leg Raises: AAROM;Left;10 reps;Supine Long Arc Quad: AAROM;Left;10 reps;Seated Knee  Flexion: AAROM;Left;10 reps;Seated Goniometric ROM: 10-60 : knee flexion    General Comments        Pertinent Vitals/Pain Pain Score: 3  Pain Location: L knee Pain Descriptors / Indicators: Aching;Squeezing Pain Intervention(s): Premedicated before session;Repositioned;Ice applied    Home Living                      Prior Function            PT Goals (current goals can now be found in the care plan section) Progress towards PT goals: Progressing toward goals    Frequency  7X/week    PT Plan Current plan remains appropriate    Co-evaluation             End of Session Equipment Utilized During Treatment: Left knee immobilizer Activity Tolerance: Patient tolerated treatment well;Patient limited by pain Patient left: in bed;with call bell/phone within reach     Time: 7829-56211045-1128 PT Time Calculation (min): 43 min  Charges:                       G Codes:      Rada HayHill, Milliana Reddoch Elizabeth 06/10/2014, 2:34 PM Blanchard KelchKaren Mayford Alberg PT [phone]

## 2014-07-22 NOTE — H&P (Signed)
Sean Miles is an 53 y.o. male.    Chief Complaint: left shoulder pain  HPI: Pt is a 53 y.o. male complaining of left shoulder pain for multiple years. Pain had continually increased since the beginning. X-rays in the clinic show end-stage arthritic changes of the left shoulder. Pt has tried various conservative treatments which have failed to alleviate their symptoms, including injections and therapy. Various options are discussed with the patient. Risks, benefits and expectations were discussed with the patient. Patient understand the risks, benefits and expectations and wishes to proceed with surgery.   PCP:  [name],[name], MD  D/C Plans:  Home with HHPT  PMH: Past Medical History  Diagnosis Date  . Complication of anesthesia     post anesthesia low sat rates  . Hypertension     followed by [name] (primary)  . Pneumonia   . Hypothyroidism     takes armour thyroid  . Arthritis     psoriatic  . Hypercholesteremia   . Anxiety     takes xanax 1- 2 times per day  . Sleep apnea     approx. 3 years ago, unsure of where it was done US[facility] PAP  . Psoriatic arthritis     PSH: Past Surgical History  Procedure Laterality Date  . Rotator cuff repair      left, august 2011  . Joint replacement      right total knee, 2006  . Hand surgery      right hand removal of growth 1991  . Tonsillectomy    . Eye surgery      lasik  . Lumbar fusion  07/19/2011    lateral lumbar fusion   . Anterior lat lumbar fusion  07/19/2011    Procedure: ANTERIOR LATERAL LUMBAR FUSION 2 LEVELS;  Surgeon: [name];  Location: [facility] OR;  Service: Orthopedics;  Laterality: N/A;  Xlif and Posterior Spinal Fusion interbody L2-L4  . Total knee arthroplasty Left 06/08/2014    Procedure: LEFT TOTAL KNEE ARTHROPLASTY;  Surgeon: [name], MD;  Location: [facility] ORS;  Service: Orthopedics;  Laterality: Left;    Social History:  reports that he quit smoking about 29 years ago. His smoking use  included Cigarettes. He has a 2.5 pack-year smoking history. He does not have any smokeless tobacco history on file. He reports that he drinks alcohol. He reports that he does not use illicit drugs.  Allergies:  No Known Allergies  Medications: No current facility-administered medications for this encounter.   Current Outpatient Prescriptions  Medication Sig Dispense Refill  . ALPRAZolam (XANAX) 0.5 MG tablet Take 0.5 mg by mouth at bedtime as needed for anxiety or sleep.     [name]. amLODipine (NORVASC) 5 MG tablet Take 5 mg by mouth at bedtime.     [name]. Apremilast (OTEZLA) 30 MG TABS Take 1 tablet by mouth 2 (two) times daily.    . benazepril-hydrochlorthiazide (LOTENSIN HCT) 20-12.5 MG per tablet Take 1 tablet by mouth 2 (two) times daily.     . fenofibrate 160 MG tablet Take 160 mg by mouth at bedtime.    . methocarbamol (ROBAXIN) 500 MG tablet Take 1 tablet (500 mg total) by mouth every 6 (six) hours as needed for muscle spasms. 90 tablet 0  . oxyCODONE (OXY IR/ROXICODONE) 5 MG immediate release tablet Take 1-4 tablets (5-20 mg total) by mouth every 3 (three) hours as needed for moderate pain, severe pain or breakthrough pain. 120 tablet 0  . pravastatin (  PRAVACHOL) 80 MG tablet Take 80 mg by mouth at bedtime.     . rivaroxaban (XARELTO) 10 MG TABS tablet Take 1 tablet (10 mg total) by mouth daily with breakfast. Take Xarelto for two and a half more weeks, then discontinue Xarelto. Once the patient has completed the blood thinner regimen, then take a Baby 81 mg Aspirin daily for three more weeks. 19 tablet 0  . thyroid (ARMOUR) 30 MG tablet Take 210 mg by mouth at bedtime.    . traMADol (ULTRAM) 50 MG tablet Take 1-2 tablets (50-100 mg total) by mouth every 6 (six) hours as needed (mild pain). 80 tablet 1    No results found for this or any previous visit (from the past 48 hour(s)). No results found.  ROS: Pain with rom of the left upper extremity Recent knee replacement  Physical  Exam:  Alert and oriented 53 y.o. male in no acute distress Cranial nerves 2-12 intact Cervical spine: full rom with no tenderness, nv intact distally Chest: active breath sounds bilaterally, no wheeze rhonchi or rales Heart: regular rate and rhythm, no murmur Abd: non tender non distended with active bowel sounds Hip is stable with rom  Left shoulder with limited rom due to weakness and pain nv intact distally No rashes or edema Strength of ER and IR 3.5/5  Assessment/Plan Assessment: left rotator cuff arthropathy  Plan: Patient will undergo a left reverse total shoulder [name]. Risks benefits and expectations were discussed with the patient. Patient understand risks, benefits and expectations and wishes to proceed.

## 2014-07-23 ENCOUNTER — Encounter (HOSPITAL_COMMUNITY)
Admission: RE | Admit: 2014-07-23 | Discharge: 2014-07-23 | Disposition: A | Discharge status: Home / Self Care | Payer: 59 | Source: Ambulatory Visit | Attending: Orthopedic Surgery | Admitting: Orthopedic Surgery

## 2014-07-23 ENCOUNTER — Encounter (HOSPITAL_COMMUNITY): Payer: Self-pay

## 2014-07-23 DIAGNOSIS — Z01812 Encounter for preprocedural laboratory examination: Secondary | ICD-10-CM | POA: Diagnosis present

## 2014-07-23 LAB — SURGICAL PCR SCREEN
MRSA, PCR: NEGATIVE
STAPHYLOCOCCUS AUREUS: NEGATIVE

## 2014-07-23 LAB — BASIC METABOLIC PANEL
ANION GAP: 13 (ref 5–15)
BUN: 11 mg/dL (ref 6–23)
CHLORIDE: 99 meq/L (ref 96–112)
CO2: 25 mEq/L (ref 19–32)
Calcium: 9.7 mg/dL (ref 8.4–10.5)
Creatinine, Ser: 0.77 mg/dL (ref 0.50–1.35)
GFR calc Af Amer: >90 mL/min (ref >90)
Glucose, Bld: 95 mg/dL (ref 70–99)
Potassium: 4.3 mEq/L (ref 3.7–5.3)
SODIUM: 137 meq/L (ref 137–147)

## 2014-07-23 LAB — CBC
HEMATOCRIT: 37.7 % — AB (ref 39.0–52.0)
Hemoglobin: 12.3 g/dL — ABNORMAL LOW (ref 13.0–17.0)
MCH: 27.6 pg (ref 26.0–34.0)
MCHC: 32.6 g/dL (ref 30.0–36.0)
MCV: 84.5 fL (ref 78.0–100.0)
PLATELETS: 337 10*3/uL (ref 150–400)
RBC: 4.46 MIL/uL (ref 4.22–5.81)
RDW: 13.4 % (ref 11.5–15.5)
WBC: 7.3 10*3/uL (ref 4.0–10.5)

## 2014-07-23 NOTE — Pre-Procedure Instructions (Signed)
Sean Miles  07/23/2014   Your procedure is scheduled on:  Friday, Dec. 11th   [facility] at 5:30 AM.  Call this number if you have problems the morning of surgery: [phone]   Remember:   Do not eat food or drink liquids after midnight Thursday.   Take these medicines the morning of surgery with A SIP OF WATER: Xanax, Robaxin, Oxycodone or Tramadol.   Do not wear jewelry, no rings or watches.  Do not wear lotions or colognes.   You may NOT wear deodorant.   Men may shave face and neck.   Do not bring valuables to the hospital.  [facility] is not responsible for any belongings or valuables.               Contacts, dentures or bridgework may not be worn into surgery.  Leave suitcase in the car. After surgery it may be brought to your room.  For patients admitted to the hospital, discharge time is determined by your treatment team.    Name and phone number of your driver:    Special Instructions: "Preparing for Surgery" instruction sheet.   Please read over the following fact sheets that you were given: Pain Booklet, Coughing and Deep Breathing, MRSA Information and Surgical Site Infection Prevention

## 2014-07-23 NOTE — Progress Notes (Addendum)
Back in 2012 he experienced "throwing heart proteins" after ? Knee surgery.   Echo was done and was neg - EF was 65%.  Has had no problems since and has been "normal". Patient states he had EKG done just this past yr @ [facility] @ [employer]  [phone] requesting it now.

## 2014-07-30 MED ORDER — DEXTROSE 5 % IV SOLN
3.0000 g | INTRAVENOUS | Status: AC
  Administered 2014-07-31: 3 g via INTRAVENOUS
  Filled 2014-07-30: qty 3000

## 2014-07-31 ENCOUNTER — Inpatient Hospital Stay (HOSPITAL_COMMUNITY)
Admission: RE | Admit: 2014-07-31 | Discharge: 2014-08-01 | DRG: 483 | Disposition: A | Discharge status: Home / Self Care | Payer: 59 | Source: Ambulatory Visit | Attending: Orthopedic Surgery | Admitting: Orthopedic Surgery

## 2014-07-31 ENCOUNTER — Inpatient Hospital Stay (HOSPITAL_COMMUNITY): Payer: 59 | Admitting: Vascular Surgery

## 2014-07-31 ENCOUNTER — Encounter (HOSPITAL_COMMUNITY): Payer: Self-pay | Admitting: Certified Registered"

## 2014-07-31 ENCOUNTER — Inpatient Hospital Stay (HOSPITAL_COMMUNITY): Payer: 59 | Admitting: Certified Registered"

## 2014-07-31 ENCOUNTER — Inpatient Hospital Stay (HOSPITAL_COMMUNITY): Payer: 59

## 2014-07-31 ENCOUNTER — Encounter (HOSPITAL_COMMUNITY)
Admission: RE | Disposition: A | Discharge status: Home / Self Care | Payer: Self-pay | Source: Ambulatory Visit | Attending: Orthopedic Surgery

## 2014-07-31 DIAGNOSIS — E039 Hypothyroidism, unspecified: Secondary | ICD-10-CM | POA: Diagnosis present

## 2014-07-31 DIAGNOSIS — M19012 Primary osteoarthritis, left shoulder: Principal | ICD-10-CM | POA: Diagnosis present

## 2014-07-31 DIAGNOSIS — F419 Anxiety disorder, unspecified: Secondary | ICD-10-CM | POA: Diagnosis present

## 2014-07-31 DIAGNOSIS — I10 Essential (primary) hypertension: Secondary | ICD-10-CM | POA: Diagnosis present

## 2014-07-31 DIAGNOSIS — Z87891 Personal history of nicotine dependence: Secondary | ICD-10-CM | POA: Diagnosis not present

## 2014-07-31 DIAGNOSIS — G4730 Sleep apnea, unspecified: Secondary | ICD-10-CM | POA: Diagnosis present

## 2014-07-31 DIAGNOSIS — Z96619 Presence of unspecified artificial shoulder joint: Secondary | ICD-10-CM

## 2014-07-31 DIAGNOSIS — Z8701 Personal history of pneumonia (recurrent): Secondary | ICD-10-CM

## 2014-07-31 DIAGNOSIS — E780 Pure hypercholesterolemia: Secondary | ICD-10-CM | POA: Diagnosis present

## 2014-07-31 DIAGNOSIS — M25512 Pain in left shoulder: Secondary | ICD-10-CM | POA: Diagnosis present

## 2014-07-31 DIAGNOSIS — Z96612 Presence of left artificial shoulder joint: Secondary | ICD-10-CM

## 2014-07-31 HISTORY — PX: REVERSE SHOULDER ARTHROPLASTY: SHX5054

## 2014-07-31 SURGERY — ARTHROPLASTY, SHOULDER, TOTAL, REVERSE
Anesthesia: Regional | Site: Shoulder | Laterality: Left

## 2014-07-31 MED ORDER — BUPIVACAINE-EPINEPHRINE 0.25% -1:200000 IJ SOLN
INTRAMUSCULAR | Status: DC | PRN
  Administered 2014-07-31: 8 mL

## 2014-07-31 MED ORDER — ASPIRIN 325 MG PO TABS
325.0000 mg | ORAL_TABLET | Freq: Every day | ORAL | Status: DC
  Administered 2014-08-01: 325 mg via ORAL
  Filled 2014-07-31 (×2): qty 1

## 2014-07-31 MED ORDER — BUPIVACAINE-EPINEPHRINE (PF) 0.25% -1:200000 IJ SOLN
INTRAMUSCULAR | Status: AC
  Filled 2014-07-31: qty 30

## 2014-07-31 MED ORDER — METOCLOPRAMIDE HCL 5 MG/ML IJ SOLN: 5.0000 mg | Freq: Three times a day (TID) | INTRAMUSCULAR | Status: DC | PRN

## 2014-07-31 MED ORDER — SUCCINYLCHOLINE CHLORIDE 20 MG/ML IJ SOLN
INTRAMUSCULAR | Status: AC
  Filled 2014-07-31: qty 1

## 2014-07-31 MED ORDER — EPHEDRINE SULFATE 50 MG/ML IJ SOLN
INTRAMUSCULAR | Status: AC
  Filled 2014-07-31: qty 1

## 2014-07-31 MED ORDER — SODIUM CHLORIDE 0.9 % IR SOLN
Status: DC | PRN
  Administered 2014-07-31: 1000 mL

## 2014-07-31 MED ORDER — ONDANSETRON HCL 4 MG/2ML IJ SOLN
4.0000 mg | Freq: Four times a day (QID) | INTRAMUSCULAR | Status: DC | PRN
  Administered 2014-07-31 – 2014-08-01 (×2): 4 mg via INTRAVENOUS
  Filled 2014-07-31 (×2): qty 2

## 2014-07-31 MED ORDER — MIDAZOLAM HCL 5 MG/5ML IJ SOLN
INTRAMUSCULAR | Status: DC | PRN
  Administered 2014-07-31 (×2): 2 mg via INTRAVENOUS

## 2014-07-31 MED ORDER — METHOCARBAMOL 500 MG PO TABS
500.0000 mg | ORAL_TABLET | Freq: Four times a day (QID) | ORAL | Status: DC | PRN
  Filled 2014-07-31 (×2): qty 1

## 2014-07-31 MED ORDER — SODIUM CHLORIDE 0.9 % IV SOLN
INTRAVENOUS | Status: DC
  Administered 2014-07-31: 17:00:00 via INTRAVENOUS

## 2014-07-31 MED ORDER — ACETAMINOPHEN 325 MG PO TABS: 650.0000 mg | ORAL_TABLET | Freq: Four times a day (QID) | ORAL | Status: DC | PRN

## 2014-07-31 MED ORDER — HYDROCHLOROTHIAZIDE 12.5 MG PO CAPS
12.5000 mg | ORAL_CAPSULE | Freq: Two times a day (BID) | ORAL | Status: DC
  Administered 2014-07-31 – 2014-08-01 (×2): 12.5 mg via ORAL
  Filled 2014-07-31 (×4): qty 1

## 2014-07-31 MED ORDER — FENTANYL CITRATE 0.05 MG/ML IJ SOLN
INTRAMUSCULAR | Status: AC
  Filled 2014-07-31: qty 5

## 2014-07-31 MED ORDER — ASPIRIN EC 81 MG PO TBEC: 81.0000 mg | DELAYED_RELEASE_TABLET | Freq: Every day | ORAL | Status: DC

## 2014-07-31 MED ORDER — PHENYLEPHRINE HCL 10 MG/ML IJ SOLN
INTRAMUSCULAR | Status: DC | PRN
  Administered 2014-07-31: 120 ug via INTRAVENOUS
  Administered 2014-07-31: 160 ug via INTRAVENOUS

## 2014-07-31 MED ORDER — OXYCODONE HCL 5 MG PO TABS: 5.0000 mg | ORAL_TABLET | Freq: Once | ORAL | Status: DC | PRN

## 2014-07-31 MED ORDER — LACTATED RINGERS IV SOLN
INTRAVENOUS | Status: DC | PRN
  Administered 2014-07-31 (×2): via INTRAVENOUS

## 2014-07-31 MED ORDER — THYROID 60 MG PO TABS
210.0000 mg | ORAL_TABLET | Freq: Every day | ORAL | Status: DC
  Administered 2014-07-31: 210 mg via ORAL
  Filled 2014-07-31 (×2): qty 1

## 2014-07-31 MED ORDER — MEPERIDINE HCL 25 MG/ML IJ SOLN: 6.2500 mg | INTRAMUSCULAR | Status: DC | PRN

## 2014-07-31 MED ORDER — BISACODYL 10 MG RE SUPP: 10.0000 mg | Freq: Every day | RECTAL | Status: DC | PRN

## 2014-07-31 MED ORDER — BENAZEPRIL HCL 20 MG PO TABS
20.0000 mg | ORAL_TABLET | Freq: Two times a day (BID) | ORAL | Status: DC
  Administered 2014-07-31 – 2014-08-01 (×2): 20 mg via ORAL
  Filled 2014-07-31 (×4): qty 1

## 2014-07-31 MED ORDER — ONDANSETRON HCL 4 MG/2ML IJ SOLN
INTRAMUSCULAR | Status: DC | PRN
  Administered 2014-07-31: 4 mg via INTRAVENOUS

## 2014-07-31 MED ORDER — BENAZEPRIL-HYDROCHLOROTHIAZIDE 20-12.5 MG PO TABS: 1.0000 | ORAL_TABLET | Freq: Two times a day (BID) | ORAL | Status: DC

## 2014-07-31 MED ORDER — DICLOFENAC SODIUM 75 MG PO TBEC
75.0000 mg | DELAYED_RELEASE_TABLET | Freq: Two times a day (BID) | ORAL | Status: DC
  Administered 2014-07-31 – 2014-08-01 (×2): 75 mg via ORAL
  Filled 2014-07-31 (×3): qty 1

## 2014-07-31 MED ORDER — METHOCARBAMOL 1000 MG/10ML IJ SOLN
500.0000 mg | Freq: Four times a day (QID) | INTRAMUSCULAR | Status: DC | PRN
  Filled 2014-07-31: qty 5

## 2014-07-31 MED ORDER — AMLODIPINE BESYLATE 5 MG PO TABS
5.0000 mg | ORAL_TABLET | Freq: Every day | ORAL | Status: DC
  Administered 2014-07-31: 5 mg via ORAL
  Filled 2014-07-31 (×2): qty 1

## 2014-07-31 MED ORDER — CEFAZOLIN SODIUM-DEXTROSE 2-3 GM-% IV SOLR
2.0000 g | Freq: Four times a day (QID) | INTRAVENOUS | Status: AC
  Administered 2014-07-31 – 2014-08-01 (×3): 2 g via INTRAVENOUS
  Filled 2014-07-31 (×5): qty 50

## 2014-07-31 MED ORDER — SUCCINYLCHOLINE CHLORIDE 20 MG/ML IJ SOLN
INTRAMUSCULAR | Status: DC | PRN
  Administered 2014-07-31: 110 mg via INTRAVENOUS

## 2014-07-31 MED ORDER — METHOTREXATE 2.5 MG PO TABS: 20.0000 mg | ORAL_TABLET | ORAL | Status: DC

## 2014-07-31 MED ORDER — ARTIFICIAL TEARS OP OINT
TOPICAL_OINTMENT | OPHTHALMIC | Status: AC
  Filled 2014-07-31: qty 3.5

## 2014-07-31 MED ORDER — LIDOCAINE HCL (CARDIAC) 20 MG/ML IV SOLN
INTRAVENOUS | Status: DC | PRN
  Administered 2014-07-31: 100 mg via INTRAVENOUS

## 2014-07-31 MED ORDER — CHLORHEXIDINE GLUCONATE 4 % EX LIQD
60.0000 mL | Freq: Once | CUTANEOUS | Status: DC
  Filled 2014-07-31: qty 60

## 2014-07-31 MED ORDER — METHOCARBAMOL 500 MG PO TABS
500.0000 mg | ORAL_TABLET | Freq: Four times a day (QID) | ORAL | Status: DC | PRN
  Administered 2014-07-31 – 2014-08-01 (×2): 500 mg via ORAL

## 2014-07-31 MED ORDER — OXYCODONE HCL 5 MG PO TABS
5.0000 mg | ORAL_TABLET | ORAL | Status: DC | PRN
  Administered 2014-07-31: 20 mg via ORAL
  Administered 2014-07-31: 10 mg via ORAL
  Administered 2014-08-01 (×4): 20 mg via ORAL
  Filled 2014-07-31 (×2): qty 4
  Filled 2014-07-31: qty 1
  Filled 2014-07-31: qty 4
  Filled 2014-07-31: qty 2
  Filled 2014-07-31: qty 3
  Filled 2014-07-31: qty 4

## 2014-07-31 MED ORDER — RIVAROXABAN 10 MG PO TABS: 10.0000 mg | ORAL_TABLET | Freq: Every day | ORAL | Status: DC

## 2014-07-31 MED ORDER — OXYCODONE-ACETAMINOPHEN 5-325 MG PO TABS: 1.0000 | ORAL_TABLET | ORAL | Status: DC | PRN

## 2014-07-31 MED ORDER — OXYCODONE HCL 5 MG PO TABS: 5.0000 mg | ORAL_TABLET | ORAL | Status: DC | PRN

## 2014-07-31 MED ORDER — PROPOFOL 10 MG/ML IV BOLUS
INTRAVENOUS | Status: AC
  Filled 2014-07-31: qty 20

## 2014-07-31 MED ORDER — APREMILAST 30 MG PO TABS: 1.0000 | ORAL_TABLET | Freq: Two times a day (BID) | ORAL | Status: DC

## 2014-07-31 MED ORDER — ONDANSETRON HCL 4 MG/2ML IJ SOLN: 4.0000 mg | Freq: Once | INTRAMUSCULAR | Status: DC | PRN

## 2014-07-31 MED ORDER — ALPRAZOLAM 0.5 MG PO TABS
0.5000 mg | ORAL_TABLET | Freq: Every evening | ORAL | Status: DC | PRN
  Administered 2014-07-31: 0.5 mg via ORAL
  Filled 2014-07-31: qty 1

## 2014-07-31 MED ORDER — HYDROMORPHONE HCL 1 MG/ML IJ SOLN: 0.2500 mg | INTRAMUSCULAR | Status: DC | PRN

## 2014-07-31 MED ORDER — METOCLOPRAMIDE HCL 10 MG PO TABS: 5.0000 mg | ORAL_TABLET | Freq: Three times a day (TID) | ORAL | Status: DC | PRN

## 2014-07-31 MED ORDER — SODIUM CHLORIDE 0.9 % IJ SOLN
INTRAMUSCULAR | Status: AC
  Filled 2014-07-31: qty 10

## 2014-07-31 MED ORDER — PHENYLEPHRINE 40 MCG/ML (10ML) SYRINGE FOR IV PUSH (FOR BLOOD PRESSURE SUPPORT)
PREFILLED_SYRINGE | INTRAVENOUS | Status: AC
  Filled 2014-07-31: qty 10

## 2014-07-31 MED ORDER — FENOFIBRATE 160 MG PO TABS
160.0000 mg | ORAL_TABLET | Freq: Every day | ORAL | Status: DC
  Administered 2014-07-31: 160 mg via ORAL
  Filled 2014-07-31 (×2): qty 1

## 2014-07-31 MED ORDER — POLYETHYLENE GLYCOL 3350 17 G PO PACK: 17.0000 g | PACK | Freq: Every day | ORAL | Status: DC | PRN

## 2014-07-31 MED ORDER — ONDANSETRON HCL 4 MG/2ML IJ SOLN
INTRAMUSCULAR | Status: AC
  Filled 2014-07-31: qty 2

## 2014-07-31 MED ORDER — ARTIFICIAL TEARS OP OINT
TOPICAL_OINTMENT | OPHTHALMIC | Status: DC | PRN
  Administered 2014-07-31: 1 via OPHTHALMIC

## 2014-07-31 MED ORDER — LIDOCAINE HCL (CARDIAC) 20 MG/ML IV SOLN
INTRAVENOUS | Status: AC
  Filled 2014-07-31: qty 5

## 2014-07-31 MED ORDER — PHENYLEPHRINE HCL 10 MG/ML IJ SOLN
10.0000 mg | INTRAMUSCULAR | Status: DC | PRN
  Administered 2014-07-31: 30 ug/min via INTRAVENOUS

## 2014-07-31 MED ORDER — MENTHOL 3 MG MT LOZG: 1.0000 | LOZENGE | OROMUCOSAL | Status: DC | PRN

## 2014-07-31 MED ORDER — TRAMADOL HCL 50 MG PO TABS: 50.0000 mg | ORAL_TABLET | Freq: Four times a day (QID) | ORAL | Status: DC | PRN

## 2014-07-31 MED ORDER — PHENOL 1.4 % MT LIQD: 1.0000 | OROMUCOSAL | Status: DC | PRN

## 2014-07-31 MED ORDER — ROCURONIUM BROMIDE 50 MG/5ML IV SOLN
INTRAVENOUS | Status: AC
  Filled 2014-07-31: qty 1

## 2014-07-31 MED ORDER — HYDROMORPHONE HCL 1 MG/ML IJ SOLN
1.0000 mg | INTRAMUSCULAR | Status: DC | PRN
  Administered 2014-07-31 (×2): 1 mg via INTRAVENOUS
  Filled 2014-07-31 (×2): qty 1

## 2014-07-31 MED ORDER — ACETAMINOPHEN 650 MG RE SUPP: 650.0000 mg | Freq: Four times a day (QID) | RECTAL | Status: DC | PRN

## 2014-07-31 MED ORDER — METHOCARBAMOL 500 MG PO TABS: 500.0000 mg | ORAL_TABLET | Freq: Three times a day (TID) | ORAL | Status: DC | PRN

## 2014-07-31 MED ORDER — ONDANSETRON HCL 4 MG PO TABS
4.0000 mg | ORAL_TABLET | Freq: Four times a day (QID) | ORAL | Status: DC | PRN
  Administered 2014-08-01: 4 mg via ORAL
  Filled 2014-07-31: qty 1

## 2014-07-31 MED ORDER — MIDAZOLAM HCL 2 MG/2ML IJ SOLN
INTRAMUSCULAR | Status: AC
  Filled 2014-07-31: qty 2

## 2014-07-31 MED ORDER — PROPOFOL 10 MG/ML IV BOLUS
INTRAVENOUS | Status: DC | PRN
  Administered 2014-07-31: 110 mg via INTRAVENOUS

## 2014-07-31 MED ORDER — OXYCODONE HCL 5 MG/5ML PO SOLN: 5.0000 mg | Freq: Once | ORAL | Status: DC | PRN

## 2014-07-31 MED ORDER — PRAVASTATIN SODIUM 80 MG PO TABS
80.0000 mg | ORAL_TABLET | Freq: Every day | ORAL | Status: DC
  Administered 2014-07-31: 80 mg via ORAL
  Filled 2014-07-31 (×2): qty 1

## 2014-07-31 MED ORDER — FENTANYL CITRATE 0.05 MG/ML IJ SOLN
INTRAMUSCULAR | Status: DC | PRN
  Administered 2014-07-31: 100 ug via INTRAVENOUS
  Administered 2014-07-31: 50 ug via INTRAVENOUS

## 2014-07-31 SURGICAL SUPPLY — 62 items
BIT DRILL 170X2.5X (BIT) IMPLANT
BIT DRL 170X2.5X (BIT)
BLADE SAG 18X100X1.27 (BLADE) ×2 IMPLANT
CAPT SHLDR REVTOTAL 1 ×2 IMPLANT
CLSR STERI-STRIP ANTIMIC 1/2X4 (GAUZE/BANDAGES/DRESSINGS) ×2 IMPLANT
COVER SURGICAL LIGHT HANDLE (MISCELLANEOUS) ×2 IMPLANT
DRAPE INCISE IOBAN 66X45 STRL (DRAPES) ×6 IMPLANT
DRAPE U-SHAPE 47X51 STRL (DRAPES) ×2 IMPLANT
DRAPE X-RAY CASS 24X20 (DRAPES) IMPLANT
DRILL 2.5 (BIT)
DRSG ADAPTIC 3X8 NADH LF (GAUZE/BANDAGES/DRESSINGS) ×2 IMPLANT
DRSG PAD ABDOMINAL 8X10 ST (GAUZE/BANDAGES/DRESSINGS) ×2 IMPLANT
DURAPREP 26ML APPLICATOR (WOUND CARE) ×2 IMPLANT
ELECT BLADE 4.0 EZ CLEAN MEGAD (MISCELLANEOUS) ×2
ELECT NEEDLE TIP 2.8 STRL (NEEDLE) ×2 IMPLANT
ELECT REM PT RETURN 9FT ADLT (ELECTROSURGICAL) ×2
ELECTRODE BLDE 4.0 EZ CLN MEGD (MISCELLANEOUS) ×1 IMPLANT
ELECTRODE REM PT RTRN 9FT ADLT (ELECTROSURGICAL) ×1 IMPLANT
GAUZE SPONGE 4X4 12PLY STRL (GAUZE/BANDAGES/DRESSINGS) ×2 IMPLANT
GLOVE BIOGEL PI ORTHO PRO 7.5 (GLOVE) ×1
GLOVE BIOGEL PI ORTHO PRO SZ8 (GLOVE) ×1
GLOVE ORTHO TXT STRL SZ7.5 (GLOVE) ×2 IMPLANT
GLOVE PI ORTHO PRO STRL 7.5 (GLOVE) ×1 IMPLANT
GLOVE PI ORTHO PRO STRL SZ8 (GLOVE) ×1 IMPLANT
GLOVE SURG ORTHO 8.5 STRL (GLOVE) ×2 IMPLANT
GOWN STRL REUS W/ TWL LRG LVL3 (GOWN DISPOSABLE) ×1 IMPLANT
GOWN STRL REUS W/ TWL XL LVL3 (GOWN DISPOSABLE) ×2 IMPLANT
GOWN STRL REUS W/TWL LRG LVL3 (GOWN DISPOSABLE) ×1
GOWN STRL REUS W/TWL XL LVL3 (GOWN DISPOSABLE) ×2
HANDPIECE INTERPULSE COAX TIP (DISPOSABLE)
KIT BASIN OR (CUSTOM PROCEDURE TRAY) ×2 IMPLANT
KIT ROOM TURNOVER OR (KITS) ×2 IMPLANT
MANIFOLD NEPTUNE II (INSTRUMENTS) ×2 IMPLANT
NEEDLE 1/2 CIR MAYO (NEEDLE) ×2 IMPLANT
NEEDLE HYPO 25GX1X1/2 BEV (NEEDLE) ×2 IMPLANT
NS IRRIG 1000ML POUR BTL (IV SOLUTION) ×2 IMPLANT
PACK SHOULDER (CUSTOM PROCEDURE TRAY) ×2 IMPLANT
PAD ARMBOARD 7.5X6 YLW CONV (MISCELLANEOUS) ×4 IMPLANT
PIN GUIDE 1.2 (PIN) IMPLANT
PIN GUIDE GLENOPHERE 1.5MX300M (PIN) IMPLANT
PIN METAGLENE 2.5 (PIN) IMPLANT
SET HNDPC FAN SPRY TIP SCT (DISPOSABLE) IMPLANT
SLING ARM LRG ADULT FOAM STRAP (SOFTGOODS) IMPLANT
SLING ARM MED ADULT FOAM STRAP (SOFTGOODS) IMPLANT
SPONGE LAP 18X18 X RAY DECT (DISPOSABLE) ×2 IMPLANT
SPONGE LAP 4X18 X RAY DECT (DISPOSABLE) ×2 IMPLANT
STRIP CLOSURE SKIN 1/2X4 (GAUZE/BANDAGES/DRESSINGS) ×2 IMPLANT
SUCTION FRAZIER TIP 10 FR DISP (SUCTIONS) ×2 IMPLANT
SUT FIBERWIRE #2 38 T-5 BLUE (SUTURE) ×4
SUT MNCRL AB 4-0 PS2 18 (SUTURE) ×2 IMPLANT
SUT VIC AB 2-0 CT1 27 (SUTURE) ×1
SUT VIC AB 2-0 CT1 TAPERPNT 27 (SUTURE) ×1 IMPLANT
SUT VICRYL 0 CT 1 36IN (SUTURE) ×2 IMPLANT
SUTURE FIBERWR #2 38 T-5 BLUE (SUTURE) ×2 IMPLANT
SYR CONTROL 10ML LL (SYRINGE) ×2 IMPLANT
TAPE CLOTH SURG 4X10 WHT LF (GAUZE/BANDAGES/DRESSINGS) ×2 IMPLANT
TOWEL OR 17X24 6PK STRL BLUE (TOWEL DISPOSABLE) ×2 IMPLANT
TOWEL OR 17X26 10 PK STRL BLUE (TOWEL DISPOSABLE) ×2 IMPLANT
TOWER CARTRIDGE SMART MIX (DISPOSABLE) IMPLANT
TRAY FOLEY CATH 16FRSI W/METER (SET/KITS/TRAYS/PACK) ×2 IMPLANT
WATER STERILE IRR 1000ML POUR (IV SOLUTION) ×2 IMPLANT
YANKAUER SUCT BULB TIP NO VENT (SUCTIONS) ×2 IMPLANT

## 2014-07-31 NOTE — Interval H&P Note (Signed)
History and Physical Interval Note:  07/31/2014 7:31 AM  Sean Miles  has presented today for surgery, with the diagnosis of LEFT SHOULDER OA/ROTATOR CUFF INSUFFICIENCY  The various methods of treatment have been discussed with the patient and family. After consideration of risks, benefits and other options for treatment, the patient has consented to  Procedure(s): LEFT REVERSE SHOULDER ARTHROPLASTY (Left) as a surgical intervention .  The patient's history has been reviewed, patient examined, no change in status, stable for surgery.  I have reviewed the patient's chart and labs.  Questions were answered to the patient's satisfaction.     [name],STEVEN R

## 2014-07-31 NOTE — Anesthesia Procedure Notes (Addendum)
Anesthesia Regional Block:  Interscalene brachial plexus block  Pre-Anesthetic Checklist: ,, timeout performed, Correct Patient, Correct Site, Correct Laterality, Correct Procedure, Correct Position, site marked, Risks and benefits discussed,  Surgical consent,  Pre-op evaluation,  At surgeon's request and post-op pain management  Laterality: Left  Prep: chloraprep       Needles:  Injection technique: Single-shot  Needle Type: Other     Needle Length: 9cm 9 cm Needle Gauge: 21 and 21 G  Needle insertion depth: 5 cm   Additional Needles:  Procedures: nerve stimulator Interscalene brachial plexus block Narrative:  Start time: 07/31/2014 7:10 AM End time: 07/31/2014 7:20 AM Injection made incrementally with aspirations every 5 mL.  Performed by: Personally  Anesthesiologist: Arta BruceSSEY, KEVIN  Additional Notes: Monitors applied. Patient sedated. Sterile prep and drape,hand hygiene and sterile gloves were used. Needle position confirmed with evoked response at 0.4 mV.Local anesthetic injected incrementally after negative aspiration.Vascular puncture avoided. No complications. The patient tolerated the procedure well.        Procedure Name: Intubation Date/Time: 07/31/2014 7:44 AM Performed by: De NurseENNIE, Vence Lalor E Pre-anesthesia Checklist: Patient identified, Emergency Drugs available, Suction available, Patient being monitored and Timeout performed Patient Re-evaluated:Patient Re-evaluated prior to inductionOxygen Delivery Method: Circle system utilized Preoxygenation: Pre-oxygenation with 100% oxygen Intubation Type: IV induction Ventilation: Mask ventilation without difficulty Laryngoscope Size: Mac and 3 Grade View: Grade I Tube type: Oral Tube size: 7.5 mm Number of attempts: 1 Airway Equipment and Method: Stylet Placement Confirmation: ETT inserted through vocal cords under direct vision,  positive ETCO2 and breath sounds checked- equal and bilateral Secured at: 23  cm Tube secured with: Tape Dental Injury: Teeth and Oropharynx as per pre-operative assessment

## 2014-07-31 NOTE — Anesthesia Preprocedure Evaluation (Addendum)
Anesthesia Evaluation  Patient identified by MRN, date of birth, ID band Patient awake    Reviewed: Allergy & Precautions, H&P , NPO status , Patient's Chart, lab work & pertinent test results  Airway Mallampati: II  TM Distance: >3 FB Neck ROM: Full    Dental  (+) Teeth Intact, Dental Advisory Given   Pulmonary sleep apnea and Continuous Positive Airway Pressure Ventilation , former smoker,          Cardiovascular hypertension, Pt. on medications Rhythm:Regular     Neuro/Psych    GI/Hepatic   Endo/Other  Hypothyroidism   Renal/GU      Musculoskeletal  (+) Arthritis -,   Abdominal   Peds  Hematology   Anesthesia Other Findings   Reproductive/Obstetrics                          Anesthesia Physical Anesthesia Plan  ASA: II  Anesthesia Plan: General   Post-op Pain Management:    Induction: Intravenous  Airway Management Planned: Oral ETT  Additional Equipment:   Intra-op Plan:   Post-operative Plan: Extubation in OR  Informed Consent: I have reviewed the patients History and Physical, chart, labs and discussed the procedure including the risks, benefits and alternatives for the proposed anesthesia with the patient or authorized representative who has indicated his/her understanding and acceptance.     Plan Discussed with: CRNA and Surgeon  Anesthesia Plan Comments:         Anesthesia Quick Evaluation

## 2014-07-31 NOTE — Anesthesia Postprocedure Evaluation (Signed)
  Anesthesia Post-op Note  Patient: Sean Miles  Procedure(s) Performed: Procedure(s): LEFT REVERSE SHOULDER ARTHROPLASTY (Left)  Patient Location: PACU  Anesthesia Type:General  Level of Consciousness: awake and alert   Airway and Oxygen Therapy: Patient Spontanous Breathing  Post-op Pain: mild  Post-op Assessment: Post-op Vital signs reviewed, Patient's Cardiovascular Status Stable, Respiratory Function Stable and Patent Airway  Post-op Vital Signs: Reviewed and stable  Last Vitals:  Filed Vitals:   07/31/14 1050  BP: 130/85  Pulse: 109  Temp:   Resp: 20    Complications: No apparent anesthesia complications

## 2014-07-31 NOTE — Brief Op Note (Signed)
07/31/2014  10:25 AM  PATIENT:  Sean Miles  53 y.o. male  PRE-OPERATIVE DIAGNOSIS:  LEFT SHOULDER OA/ROTATOR CUFF INSUFFICIENCY  POST-OPERATIVE DIAGNOSIS:  LEFT SHOULDER OA/ROTATOR CUFF INSUFFICIENCY  PROCEDURE:  Procedure(s): LEFT REVERSE SHOULDER ARTHROPLASTY (Left) DePuy Delta Xtend  SURGEON:  Surgeon(s) and Role:    * Verlee RossettiSteven R Jailynne Opperman, MD - Primary  PHYSICIAN ASSISTANT:   ASSISTANTS: Thea Gisthomas B Dixon, PA-C   ANESTHESIA:   regional and general  EBL:  Total I/O In: 1500 [I.V.:1500] Out: 150 [Blood:150]  BLOOD ADMINISTERED:none  DRAINS: none   LOCAL MEDICATIONS USED:  MARCAINE     SPECIMEN:  No Specimen  DISPOSITION OF SPECIMEN:  N/A  COUNTS:  YES  TOURNIQUET:  * No tourniquets in log *  DICTATION: .Other Dictation: Dictation Number [id number]  PLAN OF CARE: Admit to inpatient   PATIENT DISPOSITION:  PACU - hemodynamically stable.   Delay start of Pharmacological VTE agent (>24hrs) due to surgical blood loss or risk of bleeding: no

## 2014-07-31 NOTE — Discharge Instructions (Signed)
Ice to the shoulder at all times.  Keep the incision covered and clean and dry for one week, then ok to shower and get the wound wet.  Ok to remove the sling as you feel comfortable.  Keep pillow propped behind the elbow to keep the arm across your waist, don't let the elbow get behind you.  Do not push pull or lift with the left arm.  Follow up in two weeks  [phone]

## 2014-07-31 NOTE — Transfer of Care (Signed)
Immediate Anesthesia Transfer of Care Note  Patient: Sean Miles  Procedure(s) Performed: Procedure(s): LEFT REVERSE SHOULDER ARTHROPLASTY (Left)  Patient Location: PACU  Anesthesia Type:General  Level of Consciousness: awake, alert  and oriented  Airway & Oxygen Therapy: Patient Spontanous Breathing and Patient connected to nasal cannula oxygen  Post-op Assessment: Report given to PACU RN  Post vital signs: Reviewed and stable  Complications: No apparent anesthesia complications

## 2014-08-01 LAB — BASIC METABOLIC PANEL
Anion gap: 12 (ref 5–15)
BUN: 8 mg/dL (ref 6–23)
CO2: 27 mEq/L (ref 19–32)
Calcium: 8.6 mg/dL (ref 8.4–10.5)
Chloride: 100 mEq/L (ref 96–112)
Creatinine, Ser: 0.86 mg/dL (ref 0.50–1.35)
GLUCOSE: 115 mg/dL — AB (ref 70–99)
POTASSIUM: 3.4 meq/L — AB (ref 3.7–5.3)
Sodium: 139 mEq/L (ref 137–147)

## 2014-08-01 LAB — HEMOGLOBIN AND HEMATOCRIT, BLOOD
HCT: 31.9 % — ABNORMAL LOW (ref 39.0–52.0)
HEMOGLOBIN: 10.1 g/dL — AB (ref 13.0–17.0)

## 2014-08-01 NOTE — Progress Notes (Signed)
Patient ready for discharge. Family will provide transportation. 

## 2014-08-01 NOTE — Progress Notes (Signed)
Occupational Therapy Evaluation and Discharge Patient Details Name: Sean Miles MRN: [id number] DOB: 09/03/1960 Today's Date: 08/01/2014    History of Present Illness Sean Miles is a 53 y.o. Male L Reverse TSA. PMH: HTN, hypothyroidism, anxiety, with past surgical hx of L rotator cuff repair, Bil TKA, anterior lateral lumbar fusion.    Clinical Impression   PTA pt lived at home and was independent with ADLs. Pt currently requires setup/Supervision for ADLs and OT encouraged use of LUE in functional tasks, within the limits of pain. Pt performed therapeutic exercises and all education and training completed. No further acute OT needs at this time.     Follow Up Recommendations  No OT follow up;Supervision - Intermittent    Equipment Recommendations  None recommended by OT    Recommendations for Other Services       Precautions / Restrictions Precautions Precautions: Shoulder Type of Shoulder Precautions: Reverse TSA (Norris Protocol) Shoulder Interventions: Shoulder sling/immobilizer;For comfort Precaution Booklet Issued: Yes (comment) Precaution Comments: Educated pt on shoulder precautions and incorporating into ADLs. Encouraged functional use of LUE.  Required Braces or Orthoses: Sling Restrictions Weight Bearing Restrictions: Yes LUE Weight Bearing: Non weight bearing      Mobility Bed Mobility Overal bed mobility: Modified Independent                Transfers Overall transfer level: Independent                         ADL Overall ADL's : Needs assistance/impaired Eating/Feeding: Set up;Sitting   Grooming: Supervision/safety;Set up;Standing   Upper Body Bathing: Set up;Supervision/ safety;Sitting   Lower Body Bathing: Set up;Supervison/ safety;Sit to/from stand   Upper Body Dressing : Supervision/safety;Set up;Sitting   Lower Body Dressing: Supervision/safety;Set up;Sit to/from stand   Toilet Transfer:  Supervision/safety;Ambulation           Functional mobility during ADLs: Supervision/safety General ADL Comments: Pt with mild c/o nausea today, however moving well and able to use LUE minimally during functional tasks. Pt limited by pain and educated on use of LUE during ADLs for increased ROM within the limits of pain.       Vision  Pt reports no change from baseline.                    Perception Perception Perception Tested?: No   Praxis Praxis Praxis tested?: Within functional limits    Pertinent Vitals/Pain Pain Assessment: 0-10 Pain Score: 4  Pain Location: L shoulder Pain Descriptors / Indicators: Aching;Sore Pain Intervention(s): Limited activity within patient's tolerance;Monitored during session;Repositioned     Hand Dominance Right   Extremity/Trunk Assessment Upper Extremity Assessment Upper Extremity Assessment: LUE deficits/detail LUE Deficits / Details: Reverse TSA LUE: Unable to fully assess due to pain LUE Coordination: decreased gross motor   Lower Extremity Assessment Lower Extremity Assessment: Overall WFL for tasks assessed   Cervical / Trunk Assessment Cervical / Trunk Assessment: Normal   Communication Communication Communication: No difficulties   Cognition Arousal/Alertness: Awake/alert Behavior During Therapy: WFL for tasks assessed/performed Overall Cognitive Status: Within Functional Limits for tasks assessed                        Exercises Exercises: Shoulder     08/01/14 1000  Shoulder Exercises  Shoulder Flexion AAROM;Left;10 reps;Supine  Shoulder Extension AAROM;Left;10 reps;Supine  Shoulder External Rotation AAROM;Left;10 reps;Supine  Elbow Flexion AROM;Left;10 reps;Supine  Elbow Extension AROM;Left;10 reps;Supine  Wrist Flexion AROM;Left;10 reps;Supine  Wrist Extension AROM;Left;10 reps;Supine  Digit Composite Flexion AROM;Left;10 reps;Supine  Composite Extension AROM;Left;10 reps;Supine       Shoulder Instructions Shoulder Instructions Donning/doffing shirt without moving shoulder: Supervision/safety;Set-up Method for sponge bathing under operated UE: Supervision/safety;Set-up Donning/doffing sling/immobilizer: Supervision/safety;Set-up (VC's) Correct positioning of sling/immobilizer: Supervision/safety;Set-up Pendulum exercises (written home exercise program): Independent ROM for elbow, wrist and digits of operated UE: Independent Sling wearing schedule (on at all times/off for ADL's): Independent Proper positioning of operated UE when showering: Independent Positioning of UE while sleeping: Independent    Home Living Family/patient expects to be discharged to:: Private residence Living Arrangements: Spouse/significant other;Children Available Help at Discharge: Family Type of Home: House Home Access: Stairs to enter Secretary/administratorntrance Stairs-Number of Steps: 3 Entrance Stairs-Rails: Right Home Layout: Two level Alternate Level Stairs-Number of Steps: 12 Alternate Level Stairs-Rails: Right;Left     Bathroom Toilet: Standard                Prior Functioning/Environment Level of Independence: Independent        Comments: Pt enjoys golfing and is eager to return    OT Diagnosis: Acute pain    End of Session Equipment Utilized During Treatment: Other (comment) (sling) Nurse Communication: Other (comment) (pt ready for d/c from OT standpoint)  Activity Tolerance: Patient tolerated treatment well Patient left: in chair;with call bell/phone within reach   Time: 1004-1030 OT Time Calculation (min): 26 min Charges:  OT General Charges $OT Visit: 1 Procedure OT Evaluation $Initial OT Evaluation Tier I: 1 Procedure OT Treatments $Self Care/Home Management : 8-22 mins   Sean Miles, Sean Miles M 08/01/2014, 10:39 AM  Carney LivingLeeAnn Marie Aidynn Miles, OTR/L Occupational Therapist [phone] (pager)

## 2014-08-01 NOTE — Plan of Care (Signed)
Problem: Consults Goal: Diagnosis - Shoulder Surgery Outcome: Completed/Met Date Met:  08/01/14 Reverse Total Shoulder Arthroplasty Left

## 2014-08-01 NOTE — Op Note (Signed)
NAME:  Wilma FlavinMARVIN, Grahm                ACCOUNT NO.:  [id number]  MEDICAL RECORD NO.:  [id number]  LOCATION:  [facility]                         FACILITY:  [facility]  PHYSICIAN:  Almedia BallsSteven R. [name], M.D. DATE OF BIRTH:  1960-12-28  DATE OF PROCEDURE:  07/31/2014 DATE OF DISCHARGE:                              OPERATIVE REPORT   PREOPERATIVE DIAGNOSIS:  Left shoulder rotator cuff tear arthropathy.  POSTOPERATIVE DIAGNOSIS:  Left shoulder rotator cuff tear arthropathy.  PROCEDURE PERFORMED:  Left reverse total shoulder arthroplasty using DePuy Delta Xtend prosthesis.  ATTENDING SURGEON:  Almedia BallsSteven R. [name], M.D.  ASSISTANT:  Donnie Coffinhomas B. Dixon, PA-C, who scrubbed the entire procedure and necessary for satisfactory completion of surgery.  ANESTHESIA:  General anesthesia was used plus interscalene block.  ESTIMATED BLOOD LOSS:  Minimal.  FLUID REPLACEMENT:  1200 mL crystalloids.  INSTRUMENT COUNTS:  Correct.  There were no complications.  ANTIBIOTICS:  Perioperative antibiotics were given.  INDICATIONS:  The patient is a 53 year old male with worsening left shoulder pain secondary to rotator cuff tear arthropathy.  The patient has failed all measures of conservative management including injections, modification activity, anti-inflammatory, and pain medication.  The patient had disabling pain and loss of function.  Given the patient has had prior significant rotator cuff surgery with acromioplasty with thinning of the acromion, also advanced degenerative changes of both the glenoid and the humeral head including cystic formation and edema in the glenoid, we felt that reverse shoulder had the best chance to give him decent pain relief and that is his primary concern.  Certainly, this is a salvage surgery and does not produce a lot of strength away from the body overhead.  I just do not feel like CTA hemiarthroplasty would be a reasonable option for him given his thinning of the acromion.  We do  not want that metal ball to lever against the acromion causing __________ fracture, and also with the changes on the glenoid side, I felt like he would still be in pain.  He also has preoperatively poor function and pretty poor postoperative function as well.  The risks and benefits were discussed.  The patient elected to proceed with reverse shoulder arthroplasty.  Informed consent obtained.  DESCRIPTION OF PROCEDURE:  After adequate level of anesthesia was achieved, the patient was positioned in modified beach-chair position. Left shoulder correctly identified and sterilely prepped and draped in usual manner.  Time-out called.  We entered the shoulder in standard anterior deltopectoral approach.  We started at coracoid process extending down to the anterior humerus.  Dissection down through subcutaneous tissues using the Bovie.  Identified cephalic vein, took laterally the deltoid, pectoralis taken medially to the conjoined tendon, identified and retracted medially.  Subscapularis was edematous and thickened with some bursa over the top it.  We were able to release that subperiosteally off the lesser tuberosity that appeared to be in decent shape on the T1 sagittals on his MRI, so we do want to save that and repair at the end.  We went ahead and placed #2 FiberWire suture in a modified Mason-Allen suture technique to help repair at the end.  We then went ahead and released the capsule off  the inferior neck of the humerus, progressively externally rotated.  There was no rotator cuff on the head at all, either the supraspinatus, infraspinatus, and even teres minor.  Delivered the head of the wound.  We reamed up to size 14, drilled, placed our intramedullary resection guide for the humeral head. We resected 10 degrees of retroversion with an oscillating saw, took that head to the back table and retrieved some bone graft from it.  We then went ahead and removed excess osteophytes from  around inferiorly and posteriorly, and then we prepared the metaphyseal portion of the humerus with __________ reamer for the size epi 1 left, so 14 stem epi 1 left body.  Next, we went ahead and placed our trial implant, impacted that in position, again set on 0 at 10 degrees of retroversion.  We then went ahead and translated the humerus posteriorly, removed the glenoid labrum that was remaining, also the capsule which was edematous and thickened, freed up the subscap that bounced nicely.  We did a complete clearing of the glenoid and removed the remaining cartilage.  We placed our retractors to protect the axillary nerve.  We then drilled our guide pin for the metaglene preparation.  We reamed metaglene __________ by hand and then drilled out the central peg hole and impacted the metaglene into position and placed a 48 screw inferiorly locked and a 42 up at the base of the coracoid locked, and an 18 posteriorly nonlocked. Had good secure fixation of the metaglene, placed a 42 standard glenosphere into position.  We carefully protected the axillary nerve and then anchored that into place.  Once that was secured, we reduced the shoulder with a 42 +6 poly, and felt like we had good stability, removed the trial components, thoroughly irrigated the wound.  We then placed drill holes in the lesser tuberosity, and #2 FiberWire suture x2. We then used impaction grafting technique with available bone graft, placed some of that medially and impacted the HA-coated stem 14 body and size 1 epi left and 7-0, but placed in 10 degrees of retroversion.  We impacted that into position with good secure fit and then trialed with a +6 and felt like if we get a +9, it will be little tighter.  The conjoint was tight.  We had negative sulcus, negative gapping with external rotation.  Really nice balance was felt to the shoulder, so we selected the 42 +9, and impacted that onto the humeral side, reduced  the shoulder, and were pleased with our soft tissue balancing and stability. We thoroughly irrigated.  The axillary nerve was checked and was free and clear.  We repaired the subscapularis anatomically to bone with suture through bone that did not tether or limit excursion at all.  We went ahead then and thoroughly irrigated, and closed deltopectoral interval with 0 Vicryl suture followed by 2-0 Vicryl subcutaneous closure and 4-0 Monocryl for skin.  Steri-Strips applied followed by sterile dressing.  The patient tolerated the surgery well.     Almedia BallsSteven R. [name], M.D.     SRN/MEDQ  D:  07/31/2014  T:  07/31/2014  Job:  [id number]

## 2014-08-01 NOTE — Discharge Summary (Signed)
Physician Discharge Summary   Patient ID: Sean Miles [place] MRN: [id number] DOB/AGE: 02-27-1961 53 y.o.  Admit date: 07/31/2014 Discharge date: 08/01/2014  Admission Diagnoses:  Active Problems:   S/P shoulder replacement   Discharge Diagnoses:  Same   Surgeries: Procedure(s): LEFT REVERSE SHOULDER ARTHROPLASTY on 07/31/2014   Consultants: PT/OT  Discharged Condition: Stable  Hospital Course: Sean Miles Venhuizen is an 53 y.o. male who was admitted 07/31/2014 with a chief complaint of No chief complaint on file. , and found to have a diagnosis of <principal problem not specified>.  They were brought to the operating room on 07/31/2014 and underwent the above named procedures.    The patient had an uncomplicated hospital course and was stable for discharge.  Recent vital signs:  Filed Vitals:   08/01/14 0534  BP: 116/57  Pulse: 92  Temp: 98.2 F (36.8 C)  Resp: 18    Recent laboratory studies:  Results for orders placed or performed during the hospital encounter of 07/31/14  Hemoglobin and hematocrit, blood  Result Value Ref Range   Hemoglobin 10.1 (Miles) 13.0 - 17.0 g/dL   HCT 52.831.9 (Miles) 41.339.0 - 24.452.0 %  Basic metabolic panel  Result Value Ref Range   Sodium 139 137 - 147 mEq/Miles   Potassium 3.4 (Miles) 3.7 - 5.3 mEq/Miles   Chloride 100 96 - 112 mEq/Miles   CO2 27 19 - 32 mEq/Miles   Glucose, Bld 115 (H) 70 - 99 mg/dL   BUN 8 6 - 23 mg/dL   Creatinine, Ser 0.100.86 0.50 - 1.35 mg/dL   Calcium 8.6 8.4 - 27.210.5 mg/dL   GFR calc non Af Amer >90 >90 mL/min   GFR calc Af Amer >90 >90 mL/min   Anion gap 12 5 - 15    Discharge Medications:     Medication List    STOP taking these medications        traMADol 50 MG tablet  Commonly known as:  ULTRAM      TAKE these medications        ALPRAZolam 0.5 MG tablet  Commonly known as:  XANAX  Take 0.5 mg by mouth at bedtime as needed for anxiety or sleep.     amLODipine 5 MG tablet  Commonly known as:  NORVASC  Take 5 mg by mouth at bedtime.       aspirin EC 81 MG tablet  Take 81 mg by mouth daily.     benazepril-hydrochlorthiazide 20-12.5 MG per tablet  Commonly known as:  LOTENSIN HCT  Take 1 tablet by mouth 2 (two) times daily.     diclofenac 75 MG EC tablet  Commonly known as:  VOLTAREN  Take 75 mg by mouth 2 (two) times daily.     fenofibrate 160 MG tablet  Take 160 mg by mouth at bedtime.     methocarbamol 500 MG tablet  Commonly known as:  ROBAXIN  Take 1 tablet (500 mg total) by mouth every 6 (six) hours as needed for muscle spasms.     methocarbamol 500 MG tablet  Commonly known as:  ROBAXIN  Take 1 tablet (500 mg total) by mouth 3 (three) times daily as needed.     methotrexate 2.5 MG tablet  Commonly known as:  RHEUMATREX  Take 20 mg by mouth once a week. sunday     OTEZLA 30 MG Tabs  Generic drug:  Apremilast  Take 1 tablet by mouth 2 (two) times daily.     oxyCODONE 5 MG immediate  release tablet  Commonly known as:  Oxy IR/ROXICODONE  Take 1-4 tablets (5-20 mg total) by mouth every 3 (three) hours as needed for moderate pain, severe pain or breakthrough pain.     oxyCODONE-acetaminophen 5-325 MG per tablet  Commonly known as:  ROXICET  Take 1-2 tablets by mouth every 4 (four) hours as needed for severe pain.     pravastatin 80 MG tablet  Commonly known as:  PRAVACHOL  Take 80 mg by mouth at bedtime.     rivaroxaban 10 MG Tabs tablet  Commonly known as:  XARELTO  - Take 1 tablet (10 mg total) by mouth daily with breakfast. Take Xarelto for two and a half more weeks, then discontinue Xarelto.  - Once the patient has completed the blood thinner regimen, then take a Baby 81 mg Aspirin daily for three more weeks.     thyroid 30 MG tablet  Commonly known as:  ARMOUR  Take 210 mg by mouth at bedtime.        Diagnostic Studies: Dg Shoulder Left Port  07/31/2014   CLINICAL DATA:  Status post left shoulder replacement.  EXAM: LEFT SHOULDER - 1 VIEW  COMPARISON:  July 11, 2011.  FINDINGS:  The glenoid and humeral components appear to be well situated. No acute fracture dislocation is noted. Visualized ribs appear normal.  IMPRESSION: Status post left shoulder arthroplasty.   Electronically Signed   By: Roque LiasJames  Green M.D.   On: 07/31/2014 11:35    Disposition: 06-Home-Health Care Svc      Discharge Instructions    Call MD / Call 911    Complete by:  As directed   If you experience chest pain or shortness of breath, CALL 911 and be transported to the hospital emergency room.  If you develope a fever above 101 F, pus (white drainage) or increased drainage or redness at the wound, or calf pain, call your surgeon's office.     Constipation Prevention    Complete by:  As directed   Drink plenty of fluids.  Prune juice may be helpful.  You may use a stool softener, such as Colace (over the counter) 100 mg twice a day.  Use MiraLax (over the counter) for constipation as needed.     Diet - low sodium heart healthy    Complete by:  As directed      Increase activity slowly as tolerated    Complete by:  As directed            Follow-up Information    Follow up with NORRIS,STEVEN R, MD. Schedule an appointment as soon as possible for a visit in 2 weeks.   Specialty:  Orthopedic Surgery   Why:  [phone]   Contact information:   [address] [place] [address] [phone]        Signed: Thea GistDIXON,Tiffiney Sparrow B 08/01/2014, 2:35 PM

## 2014-08-01 NOTE — Progress Notes (Signed)
    Subjective: 1 Day Post-Op Procedure(s) (LRB): LEFT REVERSE SHOULDER ARTHROPLASTY (Left) Patient reports pain as 3 on 0-10 scale.   Denies CP or SOB.  Voiding without difficulty. Positive flatus. Objective: Vital signs in last 24 hours: Temp:  [98.1 F (36.7 C)-98.9 F (37.2 C)] 98.2 F (36.8 C) (12/12 0534) Pulse Rate:  [92-112] 92 (12/12 0534) Resp:  [15-24] 18 (12/12 0534) BP: (115-137)/(57-92) 116/57 mmHg (12/12 0534) SpO2:  [95 %-100 %] 95 % (12/12 0534)  Intake/Output from previous day: 12/11 0701 - 12/12 0700 In: 2609.2 [P.O.:480; I.V.:2129.2] Out: 1725 [Urine:1575; Blood:150] Intake/Output this shift:    Labs:  Recent Labs  08/01/14 0455  HGB 10.1*    Recent Labs  08/01/14 0455  HCT 31.9*    Recent Labs  08/01/14 0455  NA 139  K 3.4*  CL 100  CO2 27  BUN 8  CREATININE 0.86  GLUCOSE 115*  CALCIUM 8.6   No results for input(s): LABPT, INR in the last 72 hours.  Physical Exam: Neurologically intact ABD soft Intact pulses distally Incision: dressing C/D/I Compartment soft  Assessment/Plan: 1 Day Post-Op Procedure(s) (LRB): LEFT REVERSE SHOULDER ARTHROPLASTY (Left) Advance diet Up with therapy  Plan on d/c today after working with PT  Sean Miles,[name] D for Dr. Venita Lickahari Gradie Butrick [facility] [phone] 08/01/2014, 8:43 AM

## 2014-08-03 ENCOUNTER — Encounter (HOSPITAL_COMMUNITY): Payer: Self-pay | Admitting: Orthopedic Surgery

## 2014-08-04 NOTE — Progress Notes (Signed)
Utilization review completed.  

## 2017-06-19 ENCOUNTER — Telehealth: Payer: Self-pay | Admitting: *Deleted

## 2017-06-19 NOTE — Telephone Encounter (Signed)
Referral notes from Sigmund HazelLisa Miller, [facility] sent to scheduling

## 2017-07-10 ENCOUNTER — Encounter: Payer: Self-pay | Admitting: Cardiovascular Disease

## 2017-07-10 ENCOUNTER — Ambulatory Visit (INDEPENDENT_AMBULATORY_CARE_PROVIDER_SITE_OTHER): Payer: Managed Care, Other (non HMO) | Admitting: Cardiovascular Disease

## 2017-07-10 DIAGNOSIS — I480 Paroxysmal atrial fibrillation: Secondary | ICD-10-CM

## 2017-07-10 DIAGNOSIS — I10 Essential (primary) hypertension: Secondary | ICD-10-CM | POA: Diagnosis not present

## 2017-07-10 DIAGNOSIS — E7800 Pure hypercholesterolemia, unspecified: Secondary | ICD-10-CM

## 2017-07-10 MED ORDER — DILTIAZEM HCL ER 180 MG PO CP24: 180.0000 mg | ORAL_CAPSULE | Freq: Every day | ORAL | 6 refills | Status: DC

## 2017-07-10 MED ORDER — FLECAINIDE ACETATE 50 MG PO TABS: 50.0000 mg | ORAL_TABLET | Freq: Two times a day (BID) | ORAL | 6 refills | Status: DC

## 2017-07-10 NOTE — Assessment & Plan Note (Signed)
History of essential hypertension with blood pressures measured 135/86. He is on amlodipine, benazepril and hydrochlorothiazide. Continue current meds of her dosing.

## 2017-07-10 NOTE — Assessment & Plan Note (Signed)
On fenofibrate and pravastatin followed by his PCP.

## 2017-07-10 NOTE — Assessment & Plan Note (Signed)
History of PAF status post outpatient DC cardioversion by Dr. Tereso NewcomerAL-Khori [place] [facility] 03[place]23[facility]. He wasn't oral anticoagulation for approximately 3 months after that which was discontinued in November. He remains on diltiazem and flecainide maintaining sinus rhythm.

## 2017-07-10 NOTE — Patient Instructions (Signed)
Medication Instructions: Your physician recommends that you continue on your current medications as directed. Please refer to the Current Medication list given to you today.  Labwork: I will request recent labs from [name].   Follow-Up: Your physician wants you to follow-up in: 1 year with Dr. Allyson SabalBerry. You will receive a reminder letter in the mail two months in advance. If you don't receive a letter, please call our office to schedule the follow-up appointment.  If you need a refill on your cardiac medications before your next appointment, please call your pharmacy.

## 2017-07-10 NOTE — Assessment & Plan Note (Signed)
History of obstructive sleep apnea currently not on CPAP but pending referral to a sleep doctor and [facility]..( Earl Galasborne)

## 2017-07-10 NOTE — Progress Notes (Signed)
07/10/2017 Sean Gentaracy L Genter   11-11-1960  [id number]  Primary Physician Sigmund HazelMiller, Lisa, MD Primary Cardiologist: Runell GessJonathan J Brayla Pat MD Nicholes CalamityFACP, FACC, FAHA, [place]  HPI:  Sean Miles is a 56 y.o. 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 He was referred by Dr. Sigmund HazelLisa Miller for cardiac vascular evaluation and reestablished my practice. He has a history of treated hypertension, hyperlipidemia and obstructive sleep apnea currently not on CPAP. He did have paroxysmal A. fib and underwent [facility] [place] [facility] 03[facility]. He remained on oral anticoagulation for 3 months after that which was discontinued this past November by his cardiologist there. He remains on diltiazem and flecainide maintaining sinus rhythm. He's never had a heart attack or stroke. He has no other risk factors. He denies chest pain or shortness of breath.   Current Meds  Medication Sig  . ALPRAZolam (XANAX) 0.5 MG tablet Take 0.5 mg by mouth at bedtime as needed for anxiety or sleep.   [name]. amLODipine (NORVASC) 5 MG tablet Take 5 mg by mouth at bedtime.   [name]. Apremilast (OTEZLA) 30 MG TABS Take 1 tablet by mouth 2 (two) times daily.  [name]. aspirin 325 MG tablet Take 325 mg by mouth.  . benazepril-hydrochlorthiazide (LOTENSIN HCT) 20-12.5 MG per tablet Take 1 tablet by mouth 2 (two) times daily.   . diclofenac (VOLTAREN) 75 MG EC tablet Take 75 mg by mouth 2 (two) times daily.   [name]. diltiazem (DILACOR XR) 180 MG 24 hr capsule Take 1 capsule (180 mg total) daily by mouth.  . fenofibrate 160 MG tablet Take 160 mg by mouth at bedtime.  . flecainide (TAMBOCOR) 50 MG tablet Take 1 tablet (50 mg total) 2 (two) times daily by mouth.  . levothyroxine (SYNTHROID, LEVOTHROID) 125 MCG tablet Take 125 mcg daily by mouth.  . methocarbamol (ROBAXIN) 500 MG tablet Take 1 tablet (500 mg total) by mouth every 6 (six) hours as needed for muscle spasms.  . methocarbamol (ROBAXIN) 500 MG tablet Take 1 tablet (500 mg total)  by mouth 3 (three) times daily as needed.  . methotrexate (RHEUMATREX) 2.5 MG tablet Take 20 mg by mouth once a week. sunday  . pravastatin (PRAVACHOL) 80 MG tablet Take 80 mg by mouth at bedtime.   . traMADol (ULTRAM) 50 MG tablet TAKE 1-2 TABLET BY MOUTH FOUR TIMES A DAY AS NEEDED FOR PAIN  . [DISCONTINUED] aspirin EC 81 MG tablet Take 81 mg by mouth daily.  . [DISCONTINUED] diltiazem (DILACOR XR) 180 MG 24 hr capsule Take 180 mg daily by mouth.  . [DISCONTINUED] flecainide (TAMBOCOR) 50 MG tablet Take 50 mg 2 (two) times daily by mouth.  . [DISCONTINUED] oxyCODONE (OXY IR/ROXICODONE) 5 MG immediate release tablet Take 1-4 tablets (5-20 mg total) by mouth every 3 (three) hours as needed for moderate pain, severe pain or breakthrough pain.  . [DISCONTINUED] oxyCODONE-acetaminophen (ROXICET) 5-325 MG per tablet Take 1-2 tablets by mouth every 4 (four) hours as needed for severe pain.  . [DISCONTINUED] rivaroxaban (XARELTO) 10 MG TABS tablet Take 1 tablet (10 mg total) by mouth daily with breakfast. Take Xarelto for two and a half more weeks, then discontinue Xarelto. Once the patient has completed the blood thinner regimen, then take a Baby 81 mg Aspirin daily for three more weeks.  . [DISCONTINUED] thyroid (ARMOUR) 30 MG tablet Take 210 mg by mouth at bedtime.     No Known Allergies  Social History  Socioeconomic History  . Marital status: Married    Spouse name: Not on file  . Number of children: Not on file  . Years of education: Not on file  . Highest education level: Not on file  Social Needs  . Financial resource strain: Not on file  . Food insecurity - worry: Not on file  . Food insecurity - inability: Not on file  . Transportation needs - medical: Not on file  . Transportation needs - non-medical: Not on file  Occupational History  . Not on file  Tobacco Use  . Smoking status: Former Smoker    Packs/day: 0.25    Years: 10.00    Pack years: 2.50    Types: Cigarettes     Last attempt to quit: 07/19/1985    Years since quitting: 31.9  . Smokeless tobacco: Never Used  . Tobacco comment: approx. 20 years ago  Substance and Sexual Activity  . Alcohol use: Yes    Alcohol/week: 1.2 oz    Types: 2 Cans of beer per week    Comment: 2-3 times per weeks  . Drug use: No  . Sexual activity: Yes  Other Topics Concern  . Not on file  Social History Narrative  . Not on file     Review of Systems: General: negative for chills, fever, night sweats or weight changes.  Cardiovascular: negative for chest pain, dyspnea on exertion, edema, orthopnea, palpitations, paroxysmal nocturnal dyspnea or shortness of breath Dermatological: negative for rash Respiratory: negative for cough or wheezing Urologic: negative for hematuria Abdominal: negative for nausea, vomiting, diarrhea, bright red blood per rectum, melena, or hematemesis Neurologic: negative for visual changes, syncope, or dizziness All other systems reviewed and are otherwise negative except as noted above.    Blood pressure 135/86, pulse 76, height 5\' 10"  (1.778 m), weight 274 lb 12.8 oz (124.6 kg).  General appearance: alert and no distress Neck: no adenopathy, no carotid bruit, no JVD, supple, symmetrical, trachea midline and thyroid not enlarged, symmetric, no tenderness/mass/nodules Lungs: clear to auscultation bilaterally Heart: regular rate and rhythm, S1, S2 normal, no murmur, click, rub or gallop Extremities: extremities normal, atraumatic, no cyanosis or edema Pulses: 2+ and symmetric Skin: Skin color, texture, turgor normal. No rashes or lesions Neurologic: Alert and oriented X 3, normal strength and tone. Normal symmetric reflexes. Normal coordination and gait  EKG sinus rhythm at 76 without ST or T-wave changes. I personally reviewed his EKG.  ASSESSMENT AND PLAN:   Hyperlipidemia On fenofibrate and pravastatin followed by his PCP.  HTN (hypertension) History of essential hypertension with  blood pressures measured 135/86. He is on amlodipine, benazepril and hydrochlorothiazide. Continue current meds of her dosing.  Sleep apnea History of obstructive sleep apnea currently not on CPAP but pending referral to a sleep doctor and [facility]..( Earl Galasborne)  Paroxysmal atrial fibrillation (HCC) History of PAF status post outpatient DC cardioversion by Dr. Teres[facility]com[facility]AL-Khori [facility] 03[facility]. He wasn't oral anticoagulation for approximately 3 months after that which was discontinued in November. He remains on diltiazem and flecainide maintaining sinus rhythm.      Runell GessJonathan J. Jarquis Walker MD FACP,FACC,FAHA, [facility] 07/10/2017 8:51 AM

## 2017-08-15 ENCOUNTER — Other Ambulatory Visit: Payer: Self-pay

## 2017-08-15 MED ORDER — FLECAINIDE ACETATE 50 MG PO TABS: 50.0000 mg | ORAL_TABLET | Freq: Two times a day (BID) | ORAL | 10 refills | Status: DC

## 2018-05-15 ENCOUNTER — Telehealth: Payer: Self-pay

## 2018-05-15 NOTE — Telephone Encounter (Signed)
   [facility] Pre-operative Risk Assessment    Request for surgical clearance:  1. What type of surgery is being performed? L 4-5 Decompression and fusion cyst  2. When is this surgery scheduled? 07/10/18  3. What type of clearance is required (medical clearance vs. Pharmacy clearance to hold med vs. Both)? Both  4. Are there any medications that need to be held prior to surgery and how long? Aspirin  5. Practice name and name of physician performing surgery? [facility] Dr.Brooks  6. What is your office phone number [phone]   7.   What is your office fax number [phone]  8.   Anesthesia type (None, local, MAC, general) ? General   Thedore Mins Pugh 05/15/2018, 12:44 PM  _________________________________________________________________   (provider comments below)

## 2018-05-15 NOTE — Telephone Encounter (Signed)
   Primary Cardiologist:Sean Allyson Sabal, MD  Chart reviewed as part of pre-operative protocol coverage. Because of Sean Miles past medical history and time since last visit, he/she will require a follow-up visit in order to better assess preoperative cardiovascular risk.  Pre-op covering staff:  appt needs to be done early Oct with Dr. Allyson Miles or APP - Please schedule appointment and call patient to inform them. - Please contact requesting surgeon's office via preferred method (i.e, phone, fax) to inform them of need for appointment prior to surgery.  Nada Boozer, NP  05/15/2018, 2:27 PM

## 2018-05-16 NOTE — Telephone Encounter (Signed)
Left message for pt to call the office so that he may be scheduled an appt to see Dr. Felicity Coyer for surgery clearance.

## 2018-05-17 ENCOUNTER — Other Ambulatory Visit: Payer: Self-pay | Admitting: Cardiovascular Disease

## 2018-05-17 NOTE — Telephone Encounter (Signed)
Follow up    Patient called back and has been scheduled for 10/08 with Corine Shelter for his surgical clearance appt. I just wanted to advise.

## 2018-05-17 NOTE — Telephone Encounter (Signed)
Rx request sent to pharmacy.  

## 2018-05-28 ENCOUNTER — Encounter: Payer: Self-pay | Admitting: Cardiology

## 2018-05-28 ENCOUNTER — Ambulatory Visit (INDEPENDENT_AMBULATORY_CARE_PROVIDER_SITE_OTHER): Payer: Managed Care, Other (non HMO) | Admitting: Cardiology

## 2018-05-28 VITALS — BP 124/84 | HR 72 | Ht 70.0 in | Wt 278.2 lb

## 2018-05-28 DIAGNOSIS — I10 Essential (primary) hypertension: Secondary | ICD-10-CM | POA: Diagnosis not present

## 2018-05-28 DIAGNOSIS — I480 Paroxysmal atrial fibrillation: Secondary | ICD-10-CM

## 2018-05-28 DIAGNOSIS — Z01810 Encounter for preprocedural cardiovascular examination: Secondary | ICD-10-CM | POA: Insufficient documentation

## 2018-05-28 DIAGNOSIS — L409 Psoriasis, unspecified: Secondary | ICD-10-CM | POA: Insufficient documentation

## 2018-05-28 DIAGNOSIS — G4733 Obstructive sleep apnea (adult) (pediatric): Secondary | ICD-10-CM

## 2018-05-28 DIAGNOSIS — E782 Mixed hyperlipidemia: Secondary | ICD-10-CM | POA: Diagnosis not present

## 2018-05-28 NOTE — Progress Notes (Signed)
05/28/2018 Sean Miles   November 05, 1960  [id number]  Primary Physician Sigmund Hazel, MD Primary Cardiologist: Dr Allyson Sabal  HPI:  57 y/o male followed previously in [facility] with a history of PAF, s/p DCCV in Aug 2017. Anticoagulation was stopped 3 months later. CHADS VASC score is 1 and the pt had some side effects while on Eliquis. Other problems include HTN, HLD, OSA  (on C-pap), and psoriasis. He has been maintained on Flecainide and ASA 325 mg. He saw Dr Allyson Sabal in Nov 2018. He is in the office today for pre op clearance prior to back surgery (Dr Shon Baton). He denies chest pain or unusual dyspnea. He denies palpitations or tachycardia.    Current Outpatient Medications  Medication Sig Dispense Refill  . ALPRAZolam (XANAX) 0.5 MG tablet Take 0.5 mg by mouth at bedtime as needed for anxiety or sleep.     [name] amLODipine (NORVASC) 5 MG tablet Take 5 mg by mouth at bedtime.     [name] Apremilast (OTEZLA) 30 MG TABS Take 1 tablet by mouth 2 (two) times daily.    [name] aspirin 325 MG tablet Take 325 mg by mouth.    . benazepril-hydrochlorthiazide (LOTENSIN HCT) 20-12.5 MG per tablet Take 1 tablet by mouth 2 (two) times daily.     . diclofenac (VOLTAREN) 75 MG EC tablet Take 75 mg by mouth 2 (two) times daily.     [name] diltiazem (DILACOR XR) 180 MG 24 hr capsule Take 1 capsule (180 mg total) daily by mouth. 30 capsule 6  . fenofibrate 160 MG tablet Take 160 mg by mouth at bedtime.    . flecainide (TAMBOCOR) 50 MG tablet TAKE 1 TABLET BY MOUTH TWICE A DAY 180 tablet 3  . levothyroxine (SYNTHROID, LEVOTHROID) 125 MCG tablet Take 125 mcg daily by mouth.  3  . methocarbamol (ROBAXIN) 500 MG tablet Take 1 tablet (500 mg total) by mouth 3 (three) times daily as needed. 60 tablet 1  . methotrexate (RHEUMATREX) 2.5 MG tablet Take 20 mg by mouth once a week. sunday    . pravastatin (PRAVACHOL) 80 MG tablet Take 80 mg by mouth at bedtime.     . traMADol (ULTRAM) 50 MG tablet TAKE 1-2 TABLET BY MOUTH FOUR TIMES A DAY AS  NEEDED FOR PAIN  2   No current facility-administered medications for this visit.     No Known Allergies  Past Medical History:  Diagnosis Date  . Anxiety    takes xanax 1- 2 times per day  . Arthritis    psoriatic  . Complication of anesthesia    post anesthesia low sat rates  . Hypercholesteremia   . Hypertension    followed by Dr. Hyacinth Meeker (primary)  . Hypothyroidism    takes armour thyroid  . Pneumonia   . Psoriatic arthritis (HCC)   . Sleep apnea    approx. 3 years ago, unsure of where it was done USES C PAP    Social History   Socioeconomic History  . Marital status: Married    Spouse name: Not on file  . Number of children: Not on file  . Years of education: Not on file  . Highest education level: Not on file  Occupational History  . Not on file  Social Needs  . Financial resource strain: Not on file  . Food insecurity:    Worry: Not on file    Inability: Not on file  . Transportation needs:    Medical: Not on file  Non-medical: Not on file  Tobacco Use  . Smoking status: Former Smoker    Packs/day: 0.25    Years: 10.00    Pack years: 2.50    Types: Cigarettes    Last attempt to quit: 07/19/1985    Years since quitting: 32.8  . Smokeless tobacco: Never Used  . Tobacco comment: approx. 20 years ago  Substance and Sexual Activity  . Alcohol use: Yes    Alcohol/week: 2.0 standard drinks    Types: 2 Cans of beer per week    Comment: 2-3 times per weeks  . Drug use: No  . Sexual activity: Yes  Lifestyle  . Physical activity:    Days per week: Not on file    Minutes per session: Not on file  . Stress: Not on file  Relationships  . Social connections:    Talks on phone: Not on file    Gets together: Not on file    Attends religious service: Not on file    Active member of club or organization: Not on file    Attends meetings of clubs or organizations: Not on file    Relationship status: Not on file  . Intimate partner violence:    Fear of  current or ex partner: Not on file    Emotionally abused: Not on file    Physically abused: Not on file    Forced sexual activity: Not on file  Other Topics Concern  . Not on file  Social History Narrative  . Not on file     Family History  Problem Relation Age of Onset  . Hypertension Father   . Diabetes Father   . Stroke Father   . COPD Mother   . Heart failure Mother   . Coronary artery disease Brother      Review of Systems: General: negative for chills, fever, night sweats or weight changes.  Cardiovascular: negative for chest pain, dyspnea on exertion, edema, orthopnea, palpitations, paroxysmal nocturnal dyspnea or shortness of breath Dermatological: negative for rash Respiratory: negative for cough or wheezing Urologic: negative for hematuria Abdominal: negative for nausea, vomiting, diarrhea, bright red blood per rectum, melena, or hematemesis Neurologic: negative for visual changes, syncope, or dizziness All other systems reviewed and are otherwise negative except as noted above.    Blood pressure 124/84, pulse 72, height 5\' 10"  (1.778 m), weight 278 lb 3.2 oz (126.2 kg).  General appearance: alert, cooperative, no distress and moderately obese Neck: no carotid bruit and no JVD Lungs: clear to auscultation bilaterally Heart: regular rate and rhythm Extremities: no edema Skin: warm and dry Neurologic: Grossly normal  EKG NSR- 72  ASSESSMENT AND PLAN:   Pre-operative cardiovascular examination Pt seen today for pre op clearance prior to back surgery.  Paroxysmal atrial fibrillation (HCC) H/O OP DCCV in Aug 2017- anticoagulation stopped after 3 months by mutual descision between the patient and his cardiologist. CHADS VASC=1  Sleep apnea Obstructive sleep apnea- on C-pap  Hyperlipidemia Hyperlipidemia/hypertriglyceridemia-on Pravachol 80   PLAN  Chart reviewed and patient seen and examined as part of pre-operative protocol coverage. Given past medical  history and time since last visit, based on ACC/AHA guidelines, Sean Miles would be at acceptable risk for the planned procedure without further cardiovascular testing.   OK to stop ASA pre op. F/U Dr Allyson Sabal in one year.   I will route this recommendation to the requesting party via Epic fax function and remove from pre-op pool.  Please call with questions.  Corine Shelter, PA-C 05/28/2018, 4:15 PM

## 2018-05-28 NOTE — Patient Instructions (Signed)
Medication Instructions:   Continue same medications  If you need a refill on your cardiac medications before your next appointment, please call your pharmacy.   Lab work:  None ordered    Testing/Procedures:  None ordered  Follow-Up: At [facility], you and your health needs are our priority.  As part of our continuing mission to provide you with exceptional heart care, we have created designated Provider Care Teams.  These Care Teams include your primary Cardiologist (physician) and Advanced Practice Providers (APPs -  Physician Assistants and Nurse Practitioners) who all work together to provide you with the care you need, when you need it.  Follow up with Dr.Berry in 1 year call 2 months before to schedule

## 2018-05-28 NOTE — Assessment & Plan Note (Addendum)
Obstructive sleep apnea on C-pap 

## 2018-05-28 NOTE — Assessment & Plan Note (Signed)
Hyperlipidemia/hypertriglyceridemia-on Pravachol 80

## 2018-05-28 NOTE — Assessment & Plan Note (Addendum)
H/O OP DCCV in Aug 2017- anticoagulation stopped after 3 months by mutual descision between the patient and his cardiologist. CHADS VASC=1

## 2018-05-28 NOTE — Assessment & Plan Note (Signed)
Pt seen today for pre op clearance prior to back surgery.

## 2018-06-10 ENCOUNTER — Other Ambulatory Visit: Payer: Self-pay | Admitting: Cardiovascular Disease

## 2018-06-11 NOTE — Telephone Encounter (Signed)
Rx request sent to pharmacy.  

## 2018-06-28 ENCOUNTER — Encounter (HOSPITAL_COMMUNITY): Payer: Self-pay

## 2018-06-28 NOTE — Pre-Procedure Instructions (Signed)
Sean Miles  06/28/2018      [id number] - [place], [place] - [address] [place] [address] Phone: [phone] Fax: [phone]  [facility] - [place], [place] - [address] [place] [address] [place] [address] Phone: [phone] Fax: [phone]  [facility] - [place], [place] - [address] [place] [address] [place] [address] Phone: [phone] Fax: [phone]    Your procedure is scheduled on July 10, 2018.  Report to [facility] Admitting at 0830 AM.  Call this number if you have problems the morning of surgery:  [phone]   Remember:  Do not eat or drink after midnight.    Take these medicines the morning of surgery with A SIP OF WATER  diltizem-XR Flecainide (tambocor) Levothyroxine (synthroid) Methocarbamol (robaxin) Tramadol (ultram)-if needed for pain  Follow your surgeon's instructions on when to hold/resume aspirin.  If no instructions were given call the office to determine how they would like to you take aspirin  7 days prior to surgery STOP taking any  Diclofenac (voltaren), Aleve, Naproxen, Ibuprofen, Motrin, Advil, Goody's, BC's, all herbal medications, fish oil, and all vitamins   Do not wear jewelry  Do not wear lotions, powders, or colognes, or deodorant.  Men may shave face and neck.  Do not bring valuables to the hospital.  [facility] is not responsible for any belongings or valuables.  Contacts, dentures or bridgework may not be worn into surgery.  Leave your suitcase in the car.  After surgery it may be brought to your room.  For patients admitted to the hospital, discharge time will be determined by your treatment team.  Patients discharged the day of surgery will not be allowed to drive home.    [facility]- Preparing For Surgery  Before surgery, you can play an important role. Because skin is not  sterile, your skin needs to be as free of germs as possible. You can reduce the number of germs on your skin by washing with CHG (chlorahexidine gluconate) Soap before surgery.  CHG is an antiseptic cleaner which kills germs and bonds with the skin to continue killing germs even after washing.    Oral Hygiene is also important to reduce your risk of infection.  Remember - BRUSH YOUR TEETH THE MORNING OF SURGERY WITH YOUR REGULAR TOOTHPASTE  Please do not use if you have an allergy to CHG or antibacterial soaps. If your skin becomes reddened/irritated stop using the CHG.  Do not shave (including legs and underarms) for at least 48 hours prior to first CHG shower. It is OK to shave your face.  Please follow these instructions carefully.   1. Shower the NIGHT BEFORE SURGERY and the MORNING OF SURGERY with CHG.   2. If you chose to wash your hair, wash your hair first as usual with your normal shampoo.  3. After you shampoo, rinse your hair and body thoroughly to remove the shampoo.  4. Use CHG as you would any other liquid soap. You can apply CHG directly to the skin and wash gently with a scrungie or a clean washcloth.   5. Apply the CHG Soap to your body ONLY FROM THE NECK DOWN.  Do not use on open wounds or open sores. Avoid contact with your eyes, ears, mouth and genitals (private parts). Wash Face and genitals (private parts)  with your normal soap.  6. Wash thoroughly,  paying special attention to the area where your surgery will be performed.  7. Thoroughly rinse your body with warm water from the neck down.  8. DO NOT shower/wash with your normal soap after using and rinsing off the CHG Soap.  9. Pat yourself dry with a CLEAN TOWEL.  10. Wear CLEAN PAJAMAS to bed the night before surgery, wear comfortable clothes the morning of surgery  11. Place CLEAN SHEETS on your bed the night of your first shower and DO NOT SLEEP WITH PETS.  Day of Surgery:  Do not apply any  deodorants/lotions.  Please wear clean clothes to the hospital/surgery center.   Remember to brush your teeth WITH YOUR REGULAR TOOTHPASTE.   Please read over the following fact sheets that you were given.

## 2018-06-28 NOTE — Progress Notes (Addendum)
PCP:  Sigmund Hazel, MD  Cardiologist: Nanetta Batty, MD  EKG: 05/28/18 in EPIC  Stress test: pt denies past 5 years  ECHO: 2012 in EPIC  Cardiac Cath: pt denies  Chest x-ray: pt denies past year, no recent respiratory infections/complications

## 2018-07-01 ENCOUNTER — Encounter (HOSPITAL_COMMUNITY): Payer: Self-pay

## 2018-07-01 ENCOUNTER — Other Ambulatory Visit: Payer: Self-pay

## 2018-07-01 ENCOUNTER — Encounter (HOSPITAL_COMMUNITY)
Admission: RE | Admit: 2018-07-01 | Discharge: 2018-07-01 | Disposition: A | Discharge status: Home / Self Care | Payer: Managed Care, Other (non HMO) | Source: Ambulatory Visit | Attending: Orthopedic Surgery | Admitting: Orthopedic Surgery

## 2018-07-01 DIAGNOSIS — Z01812 Encounter for preprocedural laboratory examination: Secondary | ICD-10-CM | POA: Diagnosis present

## 2018-07-01 DIAGNOSIS — M5416 Radiculopathy, lumbar region: Secondary | ICD-10-CM | POA: Diagnosis not present

## 2018-07-01 LAB — CBC
HCT: 37.7 % — ABNORMAL LOW (ref 39.0–52.0)
HEMOGLOBIN: 11.7 g/dL — AB (ref 13.0–17.0)
MCH: 26.8 pg (ref 26.0–34.0)
MCHC: 31 g/dL (ref 30.0–36.0)
MCV: 86.5 fL (ref 80.0–100.0)
NRBC: 0 % (ref 0.0–0.2)
PLATELETS: 344 10*3/uL (ref 150–400)
RBC: 4.36 MIL/uL (ref 4.22–5.81)
RDW: 12.8 % (ref 11.5–15.5)
WBC: 7.4 10*3/uL (ref 4.0–10.5)

## 2018-07-01 LAB — BASIC METABOLIC PANEL
Anion gap: 7 (ref 5–15)
BUN: 12 mg/dL (ref 6–20)
CHLORIDE: 104 mmol/L (ref 98–111)
CO2: 26 mmol/L (ref 22–32)
CREATININE: 0.77 mg/dL (ref 0.61–1.24)
Calcium: 9.2 mg/dL (ref 8.9–10.3)
GFR calc non Af Amer: >60 mL/min (ref >60)
Glucose, Bld: 106 mg/dL — ABNORMAL HIGH (ref 70–99)
POTASSIUM: 3.9 mmol/L (ref 3.5–5.1)
SODIUM: 137 mmol/L (ref 135–145)

## 2018-07-01 LAB — SURGICAL PCR SCREEN
MRSA, PCR: NEGATIVE
Staphylococcus aureus: NEGATIVE

## 2018-07-01 NOTE — Anesthesia Preprocedure Evaluation (Addendum)
Anesthesia Evaluation  Patient identified by MRN, date of birth, ID band Patient awake    Reviewed: Allergy & Precautions, NPO status , Patient's Chart, lab work & pertinent test results  History of Anesthesia Complications Negative for: history of anesthetic complications  Airway Mallampati: III  TM Distance: >3 FB Neck ROM: Full    Dental no notable dental hx. (+) Dental Advisory Given   Pulmonary sleep apnea and Continuous Positive Airway Pressure Ventilation , former smoker,    Pulmonary exam normal        Cardiovascular hypertension, Normal cardiovascular exam     Neuro/Psych Anxiety    GI/Hepatic negative GI ROS, Neg liver ROS,   Endo/Other  Morbid obesity  Renal/GU      Musculoskeletal   Abdominal   Peds  Hematology   Anesthesia Other Findings   Reproductive/Obstetrics                           Anesthesia Physical Anesthesia Plan  ASA: III  Anesthesia Plan: General   Post-op Pain Management:    Induction: Intravenous  PONV Risk Score and Plan: 3 and Ondansetron, Dexamethasone and Scopolamine patch - Pre-op  Airway Management Planned: Oral ETT  Additional Equipment:   Intra-op Plan:   Post-operative Plan: Extubation in OR  Informed Consent: I have reviewed the patients History and Physical, chart, labs and discussed the procedure including the risks, benefits and alternatives for the proposed anesthesia with the patient or authorized representative who has indicated his/her understanding and acceptance.   Dental advisory given  Plan Discussed with: CRNA and Anesthesiologist  Anesthesia Plan Comments: (Hx of PAF, s/p DCCV in Aug 2017, follows with cardiology Dr. Allyson Sabal. Cardiac clearance by Corine Shelter, PA-C 05/28/18 stating ok to stop ASA pre op. Medical clearance by PCP Dr. Sigmund Hazel 05/27/18.)      Anesthesia Quick Evaluation

## 2018-07-06 ENCOUNTER — Ambulatory Visit (HOSPITAL_COMMUNITY): Payer: Self-pay | Admitting: Orthopedic Surgery

## 2018-07-06 NOTE — H&P (Signed)
PreOp H+P Reported by patient: Notes: L4-5 Decompression and excision of cyst on 07/10/18 Sean Miles presents today for his preop evaluation. He continues to have significant radicular left leg pain. While he does have his chronic back pain his biggest complaint over these last 3 months has been the increasing radicular left leg pain. As a result of the failure of conservative management he has elected to move forward with surgery. His MRI from 03/29/18 demonstrates a left anterior medial directed synovial cyst causing moderate to severe spinal stenosis with severe left lateral recess stenosis and impingement on the left L5 nerve root. This has progressed since the prior imaging studies. Based on the L5 radicular pain that he has on the left side I do believe this is his primary pain source. As a result we are moving forward with the L4-5 decompression and excision of the cyst.  Allergies Reviewed Allergies  Medications Reviewed Medications ALPRAZolam 0.5 mg tablet amoxicillin 500 mg capsule atorvastatin 40 mg tablet benazepril 20 mg-hydrochlorothiazide 12.5 mg tablet diclofenac sodium 75 mg tablet,delayed release DILT-XR 180 mg capsule, extended release doxycycline hyclate 100 mg tablet fenofibrate 160 mg tablet flecainide 50 mg tablet fluticasone propionate 0.05 % topical cream folic acid 1 mg tablet HYDROcodone 5 mg-acetaminophen 325 mg tablet levothyroxine 200 mcg tablet methocarbamol 750 mg tablet metHOTREXate sodium 2.5 mg tablet methylPREDNISolone 4 mg tablet mupirocin 2 % topical ointment Otezla 30 mg tablet pravastatin 80 mg tablet traMADoL 50 mg tablet  Problems Reviewed Problems Lumbar spondylosis - Onset: 05/30/2018 Lumbar radiculopathy - Onset: 04/30/2018 Degenerative scoliosis - Onset: 04/02/2018 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 Psoriatic arthritis - Onset: 03/25/2018 Hypercholesterolemia - Onset: 03/25/2018 Hypertensive disorder - Onset:  03/25/2018 Backache - Onset: 03/25/2018  Family History Reviewed Family History Father - Diabetes mellitus  Social History Reviewed Social History Tobacco Smoking Status: Never smoker Non-smoker Occupation: Agricultural engineer Alcohol intake: Moderate Hand Dominance: Right Work related injury?: N Advance directive: Y Medical Power of Attorney: Y  Surgical History Reviewed Surgical History Total knee arthroplasty - bilaterally Prosthetic arthroplasty of shoulder Lumbar spinal fusion - XLIF L2-3, L3-4 on 07/19/2011 by Dr Rolena Infante Past Medical History Reviewed Past Medical History Chronic Back Pain: Y Hypertension: Y Notes: Scoriatic Arthritis  ROS Additionally reports: Reported by Patient CONSTITUTIONAL: no Fever, no Chills, no Night Sweats, no Weight Loss  CARDIOVASCULAR: no Cough, no Shortness of Breath, no COPD, no Asthma  GASTROINTESTINAL: no Vomiting, no Nausea  MUSCULOSKELETAL: no Joint Pain, no Swelling in Joints  NEUROLOGIC: NUMBNESS, TINGLING/PARESTHESIAS, but no Difficulty with Balance ROS as noted in the HPI  Physical Exam Patient is a 57 year old male.  Clinical exam: Sean Miles is a pleasant individual, who appears younger than their stated age. He Is alert and orientated 3. No shortness of breath, chest pain. Abdomen is soft and non-tender, negative loss of bowel and bladder control, no rebound tenderness. Negative: skin lesions abrasions contusions   Lungs: CTA  Cardiac: R/R/R no rubs/gallops/murmurs  Peripheral pulses: 1+ and symmetrical in the lower extremities. Compartment soft and nontender.  Gait pattern: Patient has significant left leg radicular pain causing him to limp.  Assistive devices: No assistive devices for ambulation  Neuro: Positive left straight leg raise test with reproduction of L5 radicular pain. No focal motor deficits in the lower extremity. Symmetrical 1+ deep tendon reflexes. Negative clonus, negative Babinski test.  Kellie Simmering normal sensation in the left L5 dermatome.  Musculoskeletal: Patient has no significant back pain with direct palpation. Increased  pain with extension or rotation of the spine. Improved overall back buttock and leg pain with forward flexion. No significant hip, knee, ankle pain with isolated joint range of motion  Patient had a lumbar MRI completed on 03/29/18 he has a new L4-5 left-sided synovial cyst causing left lateral recess stenosis and impingement on the left L5 nerve root. Stable postsurgical changes L3-4 and L2-3. Moderate left lateral recess at L3-4 mild to moderate lateral recess stenosis at L2-3. Mild spinal stenosis at L1-2. Findings have not significantly changed from previous MRI except for the new synovial cyst at L4-5.  X-rays of the lumbar spine dated 03/25/18: He does have progression in the adjacent degenerative scoliosis at L1-2. He has approximately 25 of curvature from L1-L3. There is no gross hardware complications. No spondylolisthesis or spondylolysis is noted. There is advanced degenerative disc disease at L1 to and a slight increased kyphosis at L1-2.  Assessment / Plan Diagnosis: Sean Miles returns today for his preop H&P. He continues to have severe radicular left leg pain in the L5 dermatome. At this point time based on the failure of injection therapy, and self-directed physical therapy we have elected to move forward with surgery. I have gone over the surgical procedure were to be a lumbar L4-5 decompression and excision of the synovial cyst. This will address the severe central stenosis as well as left lateral recess stenosis. Although his previous surgery was 7 years ago and was an instrumented fusion at L2-3 and L3-4 there is still the potential of scar tissue at the L4-5 level and the increased risk of dural tear and leak of spinal fluid. In addition I have also discussed the other potential risks and he is expressing understanding.  Risks and benefits of surgery were  discussed with the patient. These include: Infection, bleeding, death, stroke, paralysis, ongoing or worse pain, need for additional surgery, leak of spinal fluid, adjacent segment degeneration requiring additional surgery, post-operative hematoma formation that can result in neurological compromise and the need for urgent/emergent re-operation. Loss in bowel and bladder control. Injury to major vessels that could result in the need for urgent abdominal surgery to stop bleeding. Risk of deep venous thrombosis (DVT) and the need for additional treatment. Recurrent disc herniation resulting in the need for revision surgery, which could include fusion surgery (utilizing instrumentation such as pedicle screws and intervertebral cages). Additional risk: If instrumentation is used there is a risk of migration, or breakage of that hardware that could require additional surgery.  At this point time we will move forward with the L4-5 decompression. Goal of surgery is reduction in radicular leg pain and improvement in his overall quality-of-life. Assessment: The patient is independent with the use of proper post-operative posture and body mechanics. The patient demonstrates independence with donning and doffing his brace and has been instructed to bring it with him to the hospital on the day of surgery. The patient is independent with post-operative HEP and appears to have a good understanding of the upcoming surgical procedure. The patient was provided with a copy of Dr. Rolena Infante' hospital discharge instructions and patient education website as well as pre/post-operative posture and body mechanics information. Contact information was provided.  Goals: The goal for today's visit is for patient to be independent with post-surgical HEP and brace use. The goal was met today.

## 2018-07-06 NOTE — H&P (Deleted)
  The note originally documented on this encounter has been moved the the encounter in which it belongs.  

## 2018-07-09 MED ORDER — DEXTROSE 5 % IV SOLN
3.0000 g | INTRAVENOUS | Status: AC
  Administered 2018-07-10: 3 g via INTRAVENOUS
  Filled 2018-07-09: qty 3

## 2018-07-10 ENCOUNTER — Observation Stay (HOSPITAL_COMMUNITY)
Admission: RE | Admit: 2018-07-10 | Discharge: 2018-07-11 | Disposition: A | Discharge status: Home / Self Care | Payer: Managed Care, Other (non HMO) | Source: Ambulatory Visit | Attending: Orthopedic Surgery | Admitting: Orthopedic Surgery

## 2018-07-10 ENCOUNTER — Ambulatory Visit (HOSPITAL_COMMUNITY)
Admission: RE | Disposition: A | Discharge status: Home / Self Care | Payer: Self-pay | Source: Ambulatory Visit | Attending: Orthopedic Surgery

## 2018-07-10 ENCOUNTER — Ambulatory Visit (HOSPITAL_COMMUNITY): Payer: Managed Care, Other (non HMO)

## 2018-07-10 ENCOUNTER — Encounter (HOSPITAL_COMMUNITY): Payer: Self-pay | Admitting: Urology

## 2018-07-10 ENCOUNTER — Ambulatory Visit (HOSPITAL_COMMUNITY): Payer: Managed Care, Other (non HMO) | Admitting: Anesthesiology

## 2018-07-10 ENCOUNTER — Ambulatory Visit (HOSPITAL_COMMUNITY): Payer: Managed Care, Other (non HMO) | Admitting: Physician Assistant

## 2018-07-10 ENCOUNTER — Other Ambulatory Visit: Payer: Self-pay

## 2018-07-10 DIAGNOSIS — M549 Dorsalgia, unspecified: Secondary | ICD-10-CM | POA: Insufficient documentation

## 2018-07-10 DIAGNOSIS — Z79899 Other long term (current) drug therapy: Secondary | ICD-10-CM | POA: Diagnosis not present

## 2018-07-10 DIAGNOSIS — F419 Anxiety disorder, unspecified: Secondary | ICD-10-CM | POA: Insufficient documentation

## 2018-07-10 DIAGNOSIS — Z981 Arthrodesis status: Secondary | ICD-10-CM | POA: Insufficient documentation

## 2018-07-10 DIAGNOSIS — M79605 Pain in left leg: Secondary | ICD-10-CM | POA: Insufficient documentation

## 2018-07-10 DIAGNOSIS — M7138 Other bursal cyst, other site: Secondary | ICD-10-CM | POA: Insufficient documentation

## 2018-07-10 DIAGNOSIS — M5416 Radiculopathy, lumbar region: Secondary | ICD-10-CM | POA: Diagnosis not present

## 2018-07-10 DIAGNOSIS — I10 Essential (primary) hypertension: Secondary | ICD-10-CM | POA: Insufficient documentation

## 2018-07-10 DIAGNOSIS — G4730 Sleep apnea, unspecified: Secondary | ICD-10-CM | POA: Diagnosis not present

## 2018-07-10 DIAGNOSIS — L4050 Arthropathic psoriasis, unspecified: Secondary | ICD-10-CM | POA: Insufficient documentation

## 2018-07-10 DIAGNOSIS — M1990 Unspecified osteoarthritis, unspecified site: Secondary | ICD-10-CM | POA: Insufficient documentation

## 2018-07-10 DIAGNOSIS — Z87891 Personal history of nicotine dependence: Secondary | ICD-10-CM | POA: Insufficient documentation

## 2018-07-10 DIAGNOSIS — E7800 Pure hypercholesterolemia, unspecified: Secondary | ICD-10-CM | POA: Diagnosis not present

## 2018-07-10 DIAGNOSIS — M4150 Other secondary scoliosis, site unspecified: Secondary | ICD-10-CM | POA: Diagnosis not present

## 2018-07-10 DIAGNOSIS — E039 Hypothyroidism, unspecified: Secondary | ICD-10-CM | POA: Insufficient documentation

## 2018-07-10 DIAGNOSIS — Z833 Family history of diabetes mellitus: Secondary | ICD-10-CM | POA: Insufficient documentation

## 2018-07-10 DIAGNOSIS — M48061 Spinal stenosis, lumbar region without neurogenic claudication: Secondary | ICD-10-CM | POA: Diagnosis present

## 2018-07-10 DIAGNOSIS — Z419 Encounter for procedure for purposes other than remedying health state, unspecified: Secondary | ICD-10-CM

## 2018-07-10 DIAGNOSIS — Z6841 Body Mass Index (BMI) 40.0 and over, adult: Secondary | ICD-10-CM | POA: Insufficient documentation

## 2018-07-10 DIAGNOSIS — Z96653 Presence of artificial knee joint, bilateral: Secondary | ICD-10-CM | POA: Diagnosis not present

## 2018-07-10 DIAGNOSIS — G8929 Other chronic pain: Secondary | ICD-10-CM | POA: Insufficient documentation

## 2018-07-10 DIAGNOSIS — Z7982 Long term (current) use of aspirin: Secondary | ICD-10-CM | POA: Diagnosis not present

## 2018-07-10 HISTORY — PX: LUMBAR LAMINECTOMY/DECOMPRESSION MICRODISCECTOMY: SHX5026

## 2018-07-10 SURGERY — LUMBAR LAMINECTOMY/DECOMPRESSION MICRODISCECTOMY 1 LEVEL
Anesthesia: General | Site: Back

## 2018-07-10 MED ORDER — HYDROMORPHONE HCL 1 MG/ML IJ SOLN
0.2500 mg | INTRAMUSCULAR | Status: DC | PRN
  Administered 2018-07-10 (×2): 0.5 mg via INTRAVENOUS

## 2018-07-10 MED ORDER — GABAPENTIN 300 MG PO CAPS
300.0000 mg | ORAL_CAPSULE | Freq: Three times a day (TID) | ORAL | Status: DC
  Administered 2018-07-10 – 2018-07-11 (×3): 300 mg via ORAL
  Filled 2018-07-10 (×3): qty 1

## 2018-07-10 MED ORDER — LEVOTHYROXINE SODIUM 100 MCG PO TABS
200.0000 ug | ORAL_TABLET | Freq: Every day | ORAL | Status: DC
  Administered 2018-07-11: 200 ug via ORAL
  Filled 2018-07-10: qty 2

## 2018-07-10 MED ORDER — DEXAMETHASONE SODIUM PHOSPHATE 4 MG/ML IJ SOLN
4.0000 mg | Freq: Four times a day (QID) | INTRAMUSCULAR | Status: DC
  Administered 2018-07-10: 4 mg via INTRAVENOUS
  Filled 2018-07-10: qty 1

## 2018-07-10 MED ORDER — ACETAMINOPHEN 650 MG RE SUPP: 650.0000 mg | RECTAL | Status: DC | PRN

## 2018-07-10 MED ORDER — SUGAMMADEX SODIUM 200 MG/2ML IV SOLN: INTRAVENOUS | Status: DC | PRN

## 2018-07-10 MED ORDER — SUGAMMADEX SODIUM 200 MG/2ML IV SOLN
INTRAVENOUS | Status: DC | PRN
  Administered 2018-07-10: 300 mg via INTRAVENOUS

## 2018-07-10 MED ORDER — LACTATED RINGERS IV SOLN
INTRAVENOUS | Status: DC
  Administered 2018-07-10 (×2): via INTRAVENOUS

## 2018-07-10 MED ORDER — FLEET ENEMA 7-19 GM/118ML RE ENEM: 1.0000 | ENEMA | Freq: Once | RECTAL | Status: DC

## 2018-07-10 MED ORDER — SUFENTANIL CITRATE 50 MCG/ML IV SOLN
INTRAVENOUS | Status: DC | PRN
  Administered 2018-07-10: 2.5 ug via INTRAVENOUS
  Administered 2018-07-10: 10 ug via INTRAVENOUS
  Administered 2018-07-10: 2.5 ug via INTRAVENOUS
  Administered 2018-07-10: 15 ug via INTRAVENOUS

## 2018-07-10 MED ORDER — BUPIVACAINE-EPINEPHRINE (PF) 0.25% -1:200000 IJ SOLN
INTRAMUSCULAR | Status: AC
  Filled 2018-07-10: qty 30

## 2018-07-10 MED ORDER — TRANEXAMIC ACID-NACL 1000-0.7 MG/100ML-% IV SOLN
1000.0000 mg | INTRAVENOUS | Status: AC
  Administered 2018-07-10: 1000 mg via INTRAVENOUS
  Filled 2018-07-10: qty 100

## 2018-07-10 MED ORDER — BENAZEPRIL-HYDROCHLOROTHIAZIDE 20-12.5 MG PO TABS: 1.0000 | ORAL_TABLET | Freq: Two times a day (BID) | ORAL | Status: DC

## 2018-07-10 MED ORDER — OXYCODONE-ACETAMINOPHEN 10-325 MG PO TABS: 1.0000 | ORAL_TABLET | Freq: Four times a day (QID) | ORAL | 0 refills | Status: AC | PRN

## 2018-07-10 MED ORDER — ROCURONIUM BROMIDE 100 MG/10ML IV SOLN
INTRAVENOUS | Status: DC | PRN
  Administered 2018-07-10: 100 mg via INTRAVENOUS
  Administered 2018-07-10: 10 mg via INTRAVENOUS

## 2018-07-10 MED ORDER — METHYLPREDNISOLONE ACETATE 40 MG/ML IJ SUSP
INTRAMUSCULAR | Status: DC | PRN
  Administered 2018-07-10: 40 mg

## 2018-07-10 MED ORDER — METHOCARBAMOL 500 MG PO TABS: 500.0000 mg | ORAL_TABLET | Freq: Three times a day (TID) | ORAL | 0 refills | Status: AC | PRN

## 2018-07-10 MED ORDER — DEXAMETHASONE SODIUM PHOSPHATE 10 MG/ML IJ SOLN
INTRAMUSCULAR | Status: DC | PRN
  Administered 2018-07-10: 10 mg via INTRAVENOUS

## 2018-07-10 MED ORDER — DILTIAZEM HCL ER COATED BEADS 180 MG PO CP24
180.0000 mg | ORAL_CAPSULE | Freq: Every day | ORAL | Status: DC
  Filled 2018-07-10: qty 1

## 2018-07-10 MED ORDER — OXYCODONE HCL 5 MG PO TABS
10.0000 mg | ORAL_TABLET | ORAL | Status: DC | PRN
  Administered 2018-07-10 – 2018-07-11 (×7): 10 mg via ORAL
  Filled 2018-07-10 (×7): qty 2

## 2018-07-10 MED ORDER — THROMBIN 20000 UNITS EX SOLR
CUTANEOUS | Status: DC | PRN
  Administered 2018-07-10: 20 mL via TOPICAL

## 2018-07-10 MED ORDER — CEFAZOLIN SODIUM-DEXTROSE 1-4 GM/50ML-% IV SOLN
1.0000 g | Freq: Three times a day (TID) | INTRAVENOUS | Status: AC
  Administered 2018-07-10 – 2018-07-11 (×2): 1 g via INTRAVENOUS
  Filled 2018-07-10 (×2): qty 50

## 2018-07-10 MED ORDER — MENTHOL 3 MG MT LOZG: 1.0000 | LOZENGE | OROMUCOSAL | Status: DC | PRN

## 2018-07-10 MED ORDER — ONDANSETRON HCL 4 MG PO TABS: 4.0000 mg | ORAL_TABLET | Freq: Four times a day (QID) | ORAL | Status: DC | PRN

## 2018-07-10 MED ORDER — 0.9 % SODIUM CHLORIDE (POUR BTL) OPTIME
TOPICAL | Status: DC | PRN
  Administered 2018-07-10: 1000 mL

## 2018-07-10 MED ORDER — METHOCARBAMOL 1000 MG/10ML IJ SOLN
500.0000 mg | Freq: Four times a day (QID) | INTRAVENOUS | Status: DC | PRN
  Filled 2018-07-10: qty 5

## 2018-07-10 MED ORDER — SODIUM CHLORIDE 0.9 % IV SOLN: 250.0000 mL | INTRAVENOUS | Status: DC

## 2018-07-10 MED ORDER — ALPRAZOLAM 0.5 MG PO TABS
0.5000 mg | ORAL_TABLET | Freq: Every day | ORAL | Status: DC
  Administered 2018-07-10: 1 mg via ORAL
  Filled 2018-07-10: qty 2

## 2018-07-10 MED ORDER — PROMETHAZINE HCL 25 MG/ML IJ SOLN: 6.2500 mg | INTRAMUSCULAR | Status: DC | PRN

## 2018-07-10 MED ORDER — BENAZEPRIL HCL 20 MG PO TABS
20.0000 mg | ORAL_TABLET | Freq: Two times a day (BID) | ORAL | Status: DC
  Administered 2018-07-10 – 2018-07-11 (×2): 20 mg via ORAL
  Filled 2018-07-10 (×2): qty 1

## 2018-07-10 MED ORDER — ONDANSETRON HCL 4 MG/2ML IJ SOLN
INTRAMUSCULAR | Status: DC | PRN
  Administered 2018-07-10: 4 mg via INTRAVENOUS

## 2018-07-10 MED ORDER — BUPIVACAINE-EPINEPHRINE 0.25% -1:200000 IJ SOLN
INTRAMUSCULAR | Status: DC | PRN
  Administered 2018-07-10: 10 mL

## 2018-07-10 MED ORDER — MORPHINE SULFATE (PF) 2 MG/ML IV SOLN: 2.0000 mg | INTRAVENOUS | Status: DC | PRN

## 2018-07-10 MED ORDER — SCOPOLAMINE 1 MG/3DAYS TD PT72
1.0000 | MEDICATED_PATCH | TRANSDERMAL | Status: DC
  Administered 2018-07-10: 1.5 mg via TRANSDERMAL
  Filled 2018-07-10: qty 1

## 2018-07-10 MED ORDER — METHYLPREDNISOLONE ACETATE 40 MG/ML IJ SUSP
INTRAMUSCULAR | Status: AC
  Filled 2018-07-10: qty 1

## 2018-07-10 MED ORDER — ACETAMINOPHEN 325 MG PO TABS
650.0000 mg | ORAL_TABLET | ORAL | Status: DC | PRN
  Administered 2018-07-10: 650 mg via ORAL
  Filled 2018-07-10: qty 2

## 2018-07-10 MED ORDER — PHENOL 1.4 % MT LIQD: 1.0000 | OROMUCOSAL | Status: DC | PRN

## 2018-07-10 MED ORDER — HYDROMORPHONE HCL 1 MG/ML IJ SOLN
INTRAMUSCULAR | Status: AC
  Administered 2018-07-10: 0.5 mg via INTRAVENOUS
  Filled 2018-07-10: qty 1

## 2018-07-10 MED ORDER — SODIUM CHLORIDE 0.9% FLUSH: 3.0000 mL | INTRAVENOUS | Status: DC | PRN

## 2018-07-10 MED ORDER — ONDANSETRON HCL 4 MG PO TABS: 4.0000 mg | ORAL_TABLET | Freq: Three times a day (TID) | ORAL | 0 refills | Status: AC | PRN

## 2018-07-10 MED ORDER — MIDAZOLAM HCL 2 MG/2ML IJ SOLN
INTRAMUSCULAR | Status: AC
  Filled 2018-07-10: qty 2

## 2018-07-10 MED ORDER — LACTATED RINGERS IV SOLN: INTRAVENOUS | Status: DC

## 2018-07-10 MED ORDER — MIDAZOLAM HCL 5 MG/5ML IJ SOLN
INTRAMUSCULAR | Status: DC | PRN
  Administered 2018-07-10: 2 mg via INTRAVENOUS

## 2018-07-10 MED ORDER — SODIUM CHLORIDE 0.9% FLUSH: 3.0000 mL | Freq: Two times a day (BID) | INTRAVENOUS | Status: DC

## 2018-07-10 MED ORDER — HYDROCHLOROTHIAZIDE 12.5 MG PO CAPS
12.5000 mg | ORAL_CAPSULE | Freq: Two times a day (BID) | ORAL | Status: DC
  Administered 2018-07-10 – 2018-07-11 (×3): 12.5 mg via ORAL
  Filled 2018-07-10 (×3): qty 1

## 2018-07-10 MED ORDER — PROPOFOL 10 MG/ML IV BOLUS
INTRAVENOUS | Status: AC
  Filled 2018-07-10: qty 40

## 2018-07-10 MED ORDER — SUFENTANIL CITRATE 50 MCG/ML IV SOLN
INTRAVENOUS | Status: AC
  Filled 2018-07-10: qty 1

## 2018-07-10 MED ORDER — ATORVASTATIN CALCIUM 10 MG PO TABS
40.0000 mg | ORAL_TABLET | Freq: Every day | ORAL | Status: DC
  Administered 2018-07-10: 40 mg via ORAL
  Filled 2018-07-10: qty 4

## 2018-07-10 MED ORDER — PROPOFOL 10 MG/ML IV BOLUS
INTRAVENOUS | Status: DC | PRN
  Administered 2018-07-10: 200 mg via INTRAVENOUS

## 2018-07-10 MED ORDER — OXYCODONE HCL 5 MG PO TABS: 5.0000 mg | ORAL_TABLET | ORAL | Status: DC | PRN

## 2018-07-10 MED ORDER — METHOCARBAMOL 500 MG PO TABS
500.0000 mg | ORAL_TABLET | Freq: Four times a day (QID) | ORAL | Status: DC | PRN
  Administered 2018-07-10 – 2018-07-11 (×4): 500 mg via ORAL
  Filled 2018-07-10 (×4): qty 1

## 2018-07-10 MED ORDER — THROMBIN (RECOMBINANT) 20000 UNITS EX SOLR
CUTANEOUS | Status: AC
  Filled 2018-07-10: qty 20000

## 2018-07-10 MED ORDER — DEXAMETHASONE 4 MG PO TABS
4.0000 mg | ORAL_TABLET | Freq: Four times a day (QID) | ORAL | Status: DC
  Administered 2018-07-10 – 2018-07-11 (×3): 4 mg via ORAL
  Filled 2018-07-10 (×3): qty 1

## 2018-07-10 MED ORDER — ONDANSETRON HCL 4 MG/2ML IJ SOLN: 4.0000 mg | Freq: Four times a day (QID) | INTRAMUSCULAR | Status: DC | PRN

## 2018-07-10 MED ORDER — MAGNESIUM CITRATE PO SOLN
0.5000 | Freq: Once | ORAL | Status: AC
  Administered 2018-07-11: 0.5 via ORAL
  Filled 2018-07-10: qty 296

## 2018-07-10 SURGICAL SUPPLY — 58 items
BNDG GAUZE ELAST 4 BULKY (GAUZE/BANDAGES/DRESSINGS) ×2 IMPLANT
BUR EGG ELITE 4.0 (BURR) IMPLANT
BUR MATCHSTICK NEURO 3.0 LAGG (BURR) IMPLANT
CANISTER SUCT 3000ML PPV (MISCELLANEOUS) ×2 IMPLANT
CLSR STERI-STRIP ANTIMIC 1/2X4 (GAUZE/BANDAGES/DRESSINGS) ×2 IMPLANT
CORD BI POLAR (MISCELLANEOUS) ×2 IMPLANT
COVER SURGICAL LIGHT HANDLE (MISCELLANEOUS) ×2 IMPLANT
COVER WAND RF STERILE (DRAPES) ×2 IMPLANT
DRAIN CHANNEL 15F RND FF W/TCR (WOUND CARE) IMPLANT
DRAPE POUCH INSTRU U-SHP 10X18 (DRAPES) ×2 IMPLANT
DRAPE SURG 17X23 STRL (DRAPES) ×2 IMPLANT
DRAPE U-SHAPE 47X51 STRL (DRAPES) ×2 IMPLANT
DRSG OPSITE POSTOP 3X4 (GAUZE/BANDAGES/DRESSINGS) ×2 IMPLANT
DRSG OPSITE POSTOP 4X6 (GAUZE/BANDAGES/DRESSINGS) ×2 IMPLANT
DURAPREP 26ML APPLICATOR (WOUND CARE) ×2 IMPLANT
ELECT BLADE 4.0 EZ CLEAN MEGAD (MISCELLANEOUS) ×2
ELECT CAUTERY BLADE 6.4 (BLADE) ×2 IMPLANT
ELECT PENCIL ROCKER SW 15FT (MISCELLANEOUS) ×2 IMPLANT
ELECT REM PT RETURN 9FT ADLT (ELECTROSURGICAL) ×2
ELECTRODE BLDE 4.0 EZ CLN MEGD (MISCELLANEOUS) ×1 IMPLANT
ELECTRODE REM PT RTRN 9FT ADLT (ELECTROSURGICAL) ×1 IMPLANT
EVACUATOR SILICONE 100CC (DRAIN) IMPLANT
GLOVE BIO SURGEON STRL SZ 6.5 (GLOVE) ×2 IMPLANT
GLOVE BIOGEL PI IND STRL 6.5 (GLOVE) ×1 IMPLANT
GLOVE BIOGEL PI IND STRL 8.5 (GLOVE) ×1 IMPLANT
GLOVE BIOGEL PI INDICATOR 6.5 (GLOVE) ×1
GLOVE BIOGEL PI INDICATOR 8.5 (GLOVE) ×1
GLOVE SS BIOGEL STRL SZ 8.5 (GLOVE) ×1 IMPLANT
GLOVE SUPERSENSE BIOGEL SZ 8.5 (GLOVE) ×1
GOWN STRL REUS W/ TWL LRG LVL3 (GOWN DISPOSABLE) ×3 IMPLANT
GOWN STRL REUS W/TWL 2XL LVL3 (GOWN DISPOSABLE) ×2 IMPLANT
GOWN STRL REUS W/TWL LRG LVL3 (GOWN DISPOSABLE) ×3
KIT BASIN OR (CUSTOM PROCEDURE TRAY) ×2 IMPLANT
KIT TURNOVER KIT B (KITS) ×2 IMPLANT
MATRIX HEMOSTAT SURGIFLO (HEMOSTASIS) ×2 IMPLANT
NEEDLE 22X1 1/2 (OR ONLY) (NEEDLE) ×2 IMPLANT
NEEDLE SPNL 18GX3.5 QUINCKE PK (NEEDLE) ×4 IMPLANT
NS IRRIG 1000ML POUR BTL (IV SOLUTION) ×2 IMPLANT
PACK LAMINECTOMY ORTHO (CUSTOM PROCEDURE TRAY) ×2 IMPLANT
PACK UNIVERSAL I (CUSTOM PROCEDURE TRAY) ×2 IMPLANT
PAD ARMBOARD 7.5X6 YLW CONV (MISCELLANEOUS) ×4 IMPLANT
PATTIES SURGICAL .5 X.5 (GAUZE/BANDAGES/DRESSINGS) ×2 IMPLANT
PATTIES SURGICAL .5 X1 (DISPOSABLE) ×2 IMPLANT
SPONGE SURGIFOAM ABS GEL 100 (HEMOSTASIS) ×2 IMPLANT
SUT BONE WAX W31G (SUTURE) ×2 IMPLANT
SUT MON AB 3-0 SH 27 (SUTURE) ×1
SUT MON AB 3-0 SH27 (SUTURE) ×1 IMPLANT
SUT VIC AB 0 CT1 27 (SUTURE)
SUT VIC AB 0 CT1 27XBRD ANBCTR (SUTURE) IMPLANT
SUT VIC AB 1 CT1 18XCR BRD 8 (SUTURE) ×1 IMPLANT
SUT VIC AB 1 CT1 8-18 (SUTURE) ×1
SUT VIC AB 2-0 CT1 18 (SUTURE) ×2 IMPLANT
SYR BULB IRRIGATION 50ML (SYRINGE) ×2 IMPLANT
SYR CONTROL 10ML LL (SYRINGE) ×2 IMPLANT
TOWEL GREEN STERILE (TOWEL DISPOSABLE) ×2 IMPLANT
TOWEL GREEN STERILE FF (TOWEL DISPOSABLE) ×2 IMPLANT
TRAY FOLEY MTR SLVR 16FR STAT (SET/KITS/TRAYS/PACK) ×2 IMPLANT
YANKAUER SUCT BULB TIP NO VENT (SUCTIONS) ×2 IMPLANT

## 2018-07-10 NOTE — Evaluation (Signed)
Physical Therapy Evaluation and Discharge Patient Details Name: Sean Miles MRN: [id number] DOB: 12-Aug-1961 Today's Date: 07/10/2018   History of Present Illness  Pt is a 57 y/o male s/p L4-5 decompression and excision of cyst. PMH includes sleep apnea on CPAP, HTN, and anxiety.   Clinical Impression  Patient evaluated by Physical Therapy with no further acute PT needs identified. All education has been completed and the patient has no further questions. Pt overall steady with gait and stair navigation. No LOB noted. Reviewed back precautions and walking program to perform at home. Pt reports wife will be able to assist at d/c. See below for any follow-up Physical Therapy or equipment needs. PT is signing off. Thank you for this referral. If needs change, please re-consult.      Follow Up Recommendations No PT follow up    Equipment Recommendations  None recommended by PT    Recommendations for Other Services       Precautions / Restrictions Precautions Precautions: Back Precaution Booklet Issued: Yes (comment) Precaution Comments: Reviewed back precautions with pt.  Required Braces or Orthoses: Spinal Brace Spinal Brace: Lumbar corset;Applied in standing position Restrictions Weight Bearing Restrictions: No      Mobility  Bed Mobility Overal bed mobility: Needs Assistance Bed Mobility: Rolling;Sidelying to Sit;Sit to Sidelying Rolling: Supervision Sidelying to sit: Supervision     Sit to sidelying: Supervision General bed mobility comments: Cues for log roll technique.   Transfers Overall transfer level: Needs assistance   Transfers: Sit to/from Stand Sit to Stand: Supervision         General transfer comment: Supervision for safety. Increased time required to come to standing.   Ambulation/Gait Ambulation/Gait assistance: Supervision Gait Distance (Feet): 300 Feet Assistive device: None;IV Pole Gait Pattern/deviations: Step-through pattern;Decreased  stride length Gait velocity: Decreased    General Gait Details: Slower, guarded gait, however, overall steady. No LOB noted. Educated about generalized walking program to perform at home.   Stairs Stairs: Yes Stairs assistance: Supervision Stair Management: One rail Right;Step to pattern;Forwards Number of Stairs: 3 General stair comments: Overall steady stair navigation. No LOB noted. Cues for LE sequencing.   Wheelchair Mobility    Modified Rankin (Stroke Patients Only)       Balance Overall balance assessment: No apparent balance deficits (not formally assessed)                                           Pertinent Vitals/Pain Pain Assessment: Faces Faces Pain Scale: Hurts little more Pain Location: back  Pain Descriptors / Indicators: Grimacing;Operative site guarding Pain Intervention(s): Monitored during session;Limited activity within patient's tolerance;Repositioned    Home Living Family/patient expects to be discharged to:: Private residence Living Arrangements: Spouse/significant other Available Help at Discharge: Family;Available 24 hours/day Type of Home: House Home Access: Stairs to enter Entrance Stairs-Rails: Right Entrance Stairs-Number of Steps: 3 Home Layout: Two level Home Equipment: None      Prior Function Level of Independence: Independent               Hand Dominance        Extremity/Trunk Assessment   Upper Extremity Assessment Upper Extremity Assessment: Defer to OT evaluation    Lower Extremity Assessment Lower Extremity Assessment: LLE deficits/detail LLE Deficits / Details: Reports LLE pain/numbness improved since surgery    Cervical / Trunk Assessment Cervical / Trunk Assessment: Other exceptions  Cervical / Trunk Exceptions: s/p lumbar surgery   Communication   Communication: No difficulties  Cognition Arousal/Alertness: Awake/alert Behavior During Therapy: WFL for tasks assessed/performed Overall  Cognitive Status: Within Functional Limits for tasks assessed                                        General Comments General comments (skin integrity, edema, etc.): Pt's wife and daughter present during session.     Exercises     Assessment/Plan    PT Assessment Patent does not need any further PT services  PT Problem List         PT Treatment Interventions      PT Goals (Current goals can be found in the Care Plan section)  Acute Rehab PT Goals Patient Stated Goal: to go home PT Goal Formulation: With patient Time For Goal Achievement: 07/10/18 Potential to Achieve Goals: Good    Frequency     Barriers to discharge        Co-evaluation               AM-PAC PT "6 Clicks" Daily Activity  Outcome Measure Difficulty turning over in bed (including adjusting bedclothes, sheets and blankets)?: A Little Difficulty moving from lying on back to sitting on the side of the bed? : A Little Difficulty sitting down on and standing up from a chair with arms (e.g., wheelchair, bedside commode, etc,.)?: A Little Help needed moving to and from a bed to chair (including a wheelchair)?: None Help needed walking in hospital room?: None Help needed climbing 3-5 steps with a railing? : None 6 Click Score: 21    End of Session Equipment Utilized During Treatment: Back brace Activity Tolerance: Patient tolerated treatment well Patient left: in bed;with call bell/phone within reach;with family/visitor present Nurse Communication: Mobility status PT Visit Diagnosis: Other abnormalities of gait and mobility (R26.89)    Time: 1550-1609 PT Time Calculation (min) (ACUTE ONLY): 19 min   Charges:   PT Evaluation $PT Eval Low Complexity: 1 Low          Gladys Damme, PT, DPT  Acute Rehabilitation Services  Pager: [phone] Office: [phone]   Sean Miles 07/10/2018, 4:22 PM

## 2018-07-10 NOTE — Op Note (Signed)
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  Left L5 foraminotomy for decompression.  Excision of synovial cyst.  Complications: None  Anesthesia: General  Indications: This is a very pleasant 57 year old gentleman who several years ago had an L2-4 lateral interbody fusion with posterior unilateral pedicle screw fixation.  He is done well until recently when he started having severe radicular left leg pain.  Imaging studies demonstrate an L4-5 left-sided facet cyst causing lateral recess stenosis.  Patient had a L5 left selective nerve root block and had excellent but unfortunately temporary positive results.  Because of the ongoing radicular nature of his pain and the failure to improve with self-directed physical therapy and injection therapy we elected to move forward with surgery.  All appropriate risks benefits and alternatives were discussed with the patient and his family and consent was obtained.  Operative report: Patient was brought the operating room placed by the operating room table.  After successful induction of general anesthesia and endotracheal intubation teds SCDs and a Foley were inserted.  The patient was turned prone onto the Wilson frame and all bony prominences were well-padded.  The back was prepped and draped in a standard fashion.  A timeout was taken to confirm patient procedure and all other important data.  2 needles were then placed in the back and an x-ray was taken for localization of the skin incision.  The incision site was infiltrated with quarter percent Marcaine with epinephrine.  Midline incision was made sharp dissection was carried out to the deep fascia.  The deep fascia was sharply incised and stripped the paraspinal muscles to expose the L5 and the L4 spinous process as well as  the L4-5 facet complex.  Fortunately the pedicle screw and rod did not pose a problem with the approach and exposure and so I did not need to remove the hardware.  I placed a Penfield 4 underneath the L4-5 facet complex and I took an intraoperative x-ray.  Once I confirmed I was at the L4-5 level I then performed a generous laminotomy of L5 4.  Using my Kerrison Roger I resected the medial portion of the facet complex and I took my laminotomy caudally up into the insertion of the ligamentum flavum.  Once I had the laminotomy complete I then gently started dissecting through the ligamentum flavum with my Penfield 4 until I was able to expose the facet cyst.  During the course of exposing it was disrupted and unfortunately sucked into the vacuum container.  However once the cyst was removed I was able to easily continue my dissection into the lateral recess.  I removed using a Kerrison punch Roger the overlying ligamentum flavum and the medial portion of the superior L5 facet.  My lateral recess decompression was taken out laterally until I could palpate and visualize the medial border of the L5 pedicle identified the L5 nerve root and began tracing it into the foramen.  Foraminotomies was performed with a 2 and 3 mm Kerrison punch.  Using a Penfield 4 I continued dissecting the ligamentum flavum centrally until I was at the interspinous process ligament.  At this point I could easily [facility] onto the right-hand side and into the right lateral recess confirming an adequate decompression at this side.  I did use my 2 mm Kerrison Roger on the contralateral side  by gently retracting the thecal sac and undercutting the ligamentum flavum to adequately decompress this side as well.  At this point I had an adequate central decompression and left posterior lateral recess decompression.  Hemostasis was obtained using bipolar electrocautery.  Since third x-ray was taken to confirm that I had adequately  decompressed from the L4 foramen to the L5 foramen.  Once I confirm this I irrigated copiously normal saline and had one last checked to ensure the L5 nerve root was completely decompressed.  The overlying facet was disrupted so that the cyst cannot reform.  I also confirmed that my decompression spanned the area of maximal lateral recess compression seen on the preoperative MRI.  With hemostasis obtained using bipolar electrocautery I irrigated and then placed 1 cc (40 mg)of Depo-Medrol neural structures in the lateral recess to aid in postoperative analgesia.  A thrombin-soaked Gelfoam patty was then placed over the entire laminotomy site and then I began my closure.  I closed the deep layer with interrupted #1 Vicryl sutures, then a layer 2-0 Vicryl suture, and finally 3-0 Monocryl for the skin.  Steri-Strips and a dry dressing were applied.  Patient was turned back onto the stretcher the Foley was removed and he was ultimately transferred to the PACU without incident.  At the end of the case all needle and sponge counts were correct.  There were no adverse intraoperative events.

## 2018-07-10 NOTE — Anesthesia Postprocedure Evaluation (Signed)
Anesthesia Post Note  Patient: [name]  Procedure(s) Performed: L4-5 decompression and excision of cyst (N/A Back)     Patient location during evaluation: PACU Anesthesia Type: General Level of consciousness: sedated Pain management: pain level controlled Vital Signs Assessment: post-procedure vital signs reviewed and stable Respiratory status: spontaneous breathing and respiratory function stable Cardiovascular status: stable Postop Assessment: no apparent nausea or vomiting Anesthetic complications: no    Last Vitals:  Vitals:   07/10/18 1425 07/10/18 1439  BP:  (!) 171/94  Pulse:  86  Resp:  14  Temp: 36.5 C 37 C  SpO2:  97%    Last Pain:  Vitals:   07/10/18 1439  TempSrc: Oral  PainSc:                  Phoenix Riesen DANIEL

## 2018-07-10 NOTE — Addendum Note (Signed)
Addendum  created 07/10/18 1851 by Gwenyth AllegraAdami, Betzy Barbier, CRNA   Intraprocedure Event edited

## 2018-07-10 NOTE — Discharge Instructions (Signed)

## 2018-07-10 NOTE — Anesthesia Procedure Notes (Signed)
Procedure Name: Intubation Date/Time: 07/10/2018 10:47 AM Performed by: Eligha Bridegroom, CRNA Pre-anesthesia Checklist: Patient identified, Emergency Drugs available, Suction available, Patient being monitored and Timeout performed Patient Re-evaluated:Patient Re-evaluated prior to induction Oxygen Delivery Method: Circle system utilized Preoxygenation: Pre-oxygenation with 100% oxygen Induction Type: IV induction Ventilation: Mask ventilation without difficulty and Oral airway inserted - appropriate to patient size Laryngoscope Size: Mac and 4 Grade View: Grade I Tube type: Oral Tube size: 8.0 mm Number of attempts: 1 Airway Equipment and Method: Stylet Placement Confirmation: ETT inserted through vocal cords under direct vision,  positive ETCO2 and breath sounds checked- equal and bilateral Secured at: 23 cm Tube secured with: Tape Dental Injury: Teeth and Oropharynx as per pre-operative assessment

## 2018-07-10 NOTE — OR Nursing (Signed)
XRAY TAKEN PER PROTOCOL, CONFIRMED BY DR. GREEN AT 11:30 THAT PLACEMENT WAS CORRECT.

## 2018-07-10 NOTE — Brief Op Note (Signed)
07/10/2018  1:26 PM  PATIENT:  Sean Miles  57 y.o. 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

## 2018-07-10 NOTE — Transfer of Care (Signed)
Immediate Anesthesia Transfer of Care Note  Patient: Sean Miles  Procedure(s) Performed: L4-5 decompression and excision of cyst (N/A Back)  Patient Location: PACU  Anesthesia Type:General  Level of Consciousness: awake, alert  and oriented  Airway & Oxygen Therapy: Patient Spontanous Breathing  Post-op Assessment: Report given to RN and Post -op Vital signs reviewed and stable  Post vital signs: Reviewed and stable  Last Vitals:  Vitals Value Taken Time  BP 160/90 07/10/2018  1:42 PM  Temp 36.5 C 07/10/2018  1:42 PM  Pulse 87 07/10/2018  1:45 PM  Resp 19 07/10/2018  1:45 PM  SpO2 98 % 07/10/2018  1:45 PM  Vitals shown include unvalidated device data.  Last Pain:  Vitals:   07/10/18 1342  TempSrc:   PainSc: 0-No pain         Complications: No apparent anesthesia complications

## 2018-07-11 ENCOUNTER — Encounter (HOSPITAL_COMMUNITY): Payer: Self-pay | Admitting: Orthopedic Surgery

## 2018-07-11 DIAGNOSIS — M5416 Radiculopathy, lumbar region: Secondary | ICD-10-CM | POA: Diagnosis not present

## 2018-07-11 MED FILL — Thrombin (Recombinant) For Soln 20000 Unit: CUTANEOUS | Qty: 1 | Status: AC

## 2018-07-11 NOTE — Evaluation (Addendum)
Occupational Therapy Evaluation Patient Details Name: Sean Miles MRN: [id number] DOB: 23-Oct-1960 Today's Date: 07/11/2018    History of Present Illness Pt is a 57 y/o male s/p L4-5 decompression and excision of cyst. PMH includes sleep apnea on CPAP, HTN, and anxiety.    Clinical Impression   This 57 y/o male presents with the above. At baseline pt is independent with ADLs and functional mobility. Pt greeted in hallway at start of session while pt performing functional mobility, completing without AD and distant supervision, with no LOB noted. Pt currently requiring supervision for UB ADL, minguard assist for LB ADL. Educated pt re: back precautions, brace management, safety and compensatory strategies for performing ADL and functional transfers while maintaining precautions with pt verbalizing understanding. Pt reports he will return home with spouse and daughter who are able to assist with ADLs PRN. Questions answered throughout with no further acute OT needs identified at this time. Feel pt is safe to return home from OT standpoint once medically ready. Acute OT to sign off, thank you for this referral.     Follow Up Recommendations  No OT follow up;Supervision - Intermittent    Equipment Recommendations  None recommended by OT           Precautions / Restrictions Precautions Precautions: Back Precaution Booklet Issued: Yes (comment) Precaution Comments: Reviewed back precautions with pt. Pt able to recall and verbalize to therapist Required Braces or Orthoses: Spinal Brace Spinal Brace: Lumbar corset;Applied in standing position Restrictions Weight Bearing Restrictions: No      Mobility Bed Mobility               General bed mobility comments: verbally reviewed log roll technique  Transfers Overall transfer level: Needs assistance   Transfers: Sit to/from Stand Sit to Stand: Supervision         General transfer comment: Supervision for safety.      Balance Overall balance assessment: No apparent balance deficits (not formally assessed)                                         ADL either performed or assessed with clinical judgement   ADL Overall ADL's : Needs assistance/impaired Eating/Feeding: Independent;Sitting   Grooming: Standing;Supervision/safety   Upper Body Bathing: Supervision/ safety;Standing   Lower Body Bathing: Supervison/ safety;Sit to/from stand Lower Body Bathing Details (indicate cue type and reason): pt reports he will most likely stand to shower; educated re: LB bathing while maintaining back precautions including use of AE to reach LEs, pt verbalizing understanding Upper Body Dressing : Set up;Supervision/safety;Sitting   Lower Body Dressing: Min guard;Sit to/from stand Lower Body Dressing Details (indicate cue type and reason): pt demonstrates figure 4 technique for LB dressing; educated to either utilize this technique or have spouse assist PRN Toilet Transfer: Supervision/safety;Ambulation;Regular Toilet   Toileting- Architect and Hygiene: Supervision/safety;Sit to/from stand       Functional mobility during ADLs: Supervision/safety General ADL Comments: pt ambulating in hallway start of session; educated pt re: back precautions, safety and compensatory strategies for performing ADL and functional transfers while maintianing precautions     Vision         Perception     Praxis      Pertinent Vitals/Pain Pain Assessment: Faces Faces Pain Scale: Hurts a little bit Pain Location: back  Pain Descriptors / Indicators: Grimacing;Operative site guarding Pain Intervention(s): Monitored during  session     Hand Dominance     Extremity/Trunk Assessment Upper Extremity Assessment Upper Extremity Assessment: Overall WFL for tasks assessed   Lower Extremity Assessment Lower Extremity Assessment: Defer to PT evaluation   Cervical / Trunk Assessment Cervical / Trunk  Assessment: Other exceptions Cervical / Trunk Exceptions: s/p lumbar surgery    Communication Communication Communication: No difficulties   Cognition Arousal/Alertness: Awake/alert Behavior During Therapy: WFL for tasks assessed/performed Overall Cognitive Status: Within Functional Limits for tasks assessed                                     General Comments       Exercises     Shoulder Instructions      Home Living Family/patient expects to be discharged to:: Private residence Living Arrangements: Spouse/significant other Available Help at Discharge: Family;Available 24 hours/day Type of Home: House Home Access: Stairs to enter Enter[employer] CorporationEntrance Stairs-Number of Steps: 3 Entrance Stairs-Rails: Right Home Layout: Two level Alternate Level Stairs-Number of Steps: flight  Alternate Level Stairs-Rails: Right Bathroom Shower/Tub: Producer, television/film/videoWalk-in shower   Bathroom Toilet: Standard     Home Equipment: None          Prior Functioning/Environment Level of Independence: Independent                 OT Problem List: Decreased range of motion;Decreased knowledge of precautions;Obesity;Pain      OT Treatment/Interventions:      OT Goals(Current goals can be found in the care plan section) Acute Rehab OT Goals Patient Stated Goal: to go home OT Goal Formulation: All assessment and education complete, DC therapy  OT Frequency:     Barriers to D/C:            Co-evaluation              AM-PAC PT "6 Clicks" Daily Activity     Outcome Measure Help from another person eating meals?: None Help from another person taking care of personal grooming?: None Help from another person toileting, which includes using toliet, bedpan, or urinal?: None Help from another person bathing (including washing, rinsing, drying)?: None Help from another person to put on and taking off regular upper body clothing?: None Help from another person to put on and taking off regular  lower body clothing?: A Little 6 Click Score: 23   End of Session Equipment Utilized During Treatment: Back brace Nurse Communication: Mobility status  Activity Tolerance: Patient tolerated treatment well Patient left: with call bell/phone within reach(seated EOB to eat breakfast)  OT Visit Diagnosis: Other abnormalities of gait and mobility (R26.89)                Time: 1610-9604: 0815-0825 OT Time Calculation (min): 10 min Charges:  OT General Charges $OT Visit: 1 Visit OT Evaluation $OT Eval Low Complexity: 1 Low  Marcy SirenBreanna Ipek Westra, OT Supplemental Rehabilitation Services Pager [phone] Office [phone]   Orlando PennerBreanna L Iyanni Hepp 07/11/2018, 8:59 AM

## 2018-07-11 NOTE — Progress Notes (Signed)
Patient is discharged from room 3C07 at this time. Alert and in stable condition. IV site d/c'd and instructions read to patient and spouse with understanding verbalized. Left unit via wheelchair with all belongings at side. 

## 2018-07-11 NOTE — Progress Notes (Signed)
    Subjective: Procedure(s) (LRB): L4-5 decompression and excision of cyst (N/A) 1 Day Post-Op  Patient reports pain as 1 on 0-10 scale.  Reports decreased leg pain denies incisional back pain   Positive void Negative bowel movement Positive flatus Negative chest pain or shortness of breath  Objective: Vital signs in last 24 hours: Temp:  [97.7 F (36.5 C)-100.2 F (37.9 C)] 98.7 F (37.1 C) (11/21 0906) Pulse Rate:  [84-114] 90 (11/21 0906) Resp:  [14-21] 18 (11/21 0906) BP: (105-171)/(66-97) 146/80 (11/21 0906) SpO2:  [93 %-99 %] 95 % (11/21 0906) Weight:  [127.6 kg] 127.6 kg (11/20 1459)  Intake/Output from previous day: 11/20 0701 - 11/21 0700 In: 3476.2 [P.O.:1020; I.V.:2356.2; IV Piggyback:100] Out: 275 [Urine:175; Blood:100]  Labs: No results for input(s): WBC, RBC, HCT, PLT in the last 72 hours. No results for input(s): NA, K, CL, CO2, BUN, CREATININE, GLUCOSE, CALCIUM in the last 72 hours. No results for input(s): LABPT, INR in the last 72 hours.  Physical Exam: Neurologically intact ABD soft Intact pulses distally Dorsiflexion/Plantar flexion intact Incision: dressing C/D/I Compartment soft Body mass index is 40.36 kg/m.   Assessment/Plan: Patient stable  xrays n/a Continue mobilization with physical therapy Continue care  Advance diet Up with therapy  Plan on d/c to home later today  Venita Lickahari Janat Tabbert, MD [facility] [phone]

## 2018-07-15 NOTE — Discharge Summary (Signed)
Patient ID: Sean Miles MRN: [id number] DOB/AGE: 59-10-1960 59 y.o.  Admit date: 07/10/2018 Discharge date: 07/15/2018  Admission Diagnoses:  Active Problems:   Lumbar stenosis   Discharge Diagnoses:  Active Problems:   Lumbar stenosis  status post Procedure(s): L4-5 decompression and excision of cyst  Past Medical History:  Diagnosis Date  . Anxiety    takes xanax 1- 2 times per day  . Arthritis    psoriatic  . Complication of anesthesia    post anesthesia low sat rates  . Hypercholesteremia   . Hypertension    followed by Dr. Hyacinth Meeker (primary)  . Hypothyroidism    takes armour thyroid  . Pneumonia   . Psoriatic arthritis (HCC)   . Sleep apnea    approx. 3 years ago, unsure of where it was done USES C PAP    Surgeries: Procedure(s): L4-5 decompression and excision of cyst on 07/10/2018   Consultants:   Discharged Condition: Improved  Hospital Course: NATHANIAL Miles is an 57 y.o. male who was admitted 07/10/2018 for operative treatment of L4/5 stenosis second to left facet cyst. Patient failed conservative treatments (please see the history and physical for the specifics) and had severe unremitting pain that affects sleep, daily activities and work/hobbies. After pre-op clearance, the patient was taken to the operating room on 07/10/2018 and underwent  Procedure(s): L4-5 decompression and excision of cyst.    Patient was given perioperative antibiotics:  Anti-infectives (From admission, onward)   Start     Dose/Rate Route Frequency Ordered Stop   07/10/18 1900  ceFAZolin (ANCEF) IVPB 1 g/50 mL premix     1 g 100 mL/hr over 30 Minutes Intravenous Every 8 hours 07/10/18 1441 07/11/18 0233   07/10/18 0930  ceFAZolin (ANCEF) 3 g in dextrose 5 % 50 mL IVPB     3 g 100 mL/hr over 30 Minutes Intravenous To ShortStay Surgical 07/09/18 1422 07/10/18 1505       Patient was given sequential compression devices and early ambulation to prevent DVT.   Patient  benefited maximally from hospital stay and there were no complications. At the time of discharge, the patient was urinating/moving their bowels without difficulty, tolerating a regular diet, pain is controlled with oral pain medications and they have been cleared by PT/OT.   Hospital course was uneventful.  Pre-op radicular leg pain resolved and incisional back pain was improving  Recent vital signs: No data found.   Recent laboratory studies: No results for input(s): WBC, HGB, HCT, PLT, NA, K, CL, CO2, BUN, CREATININE, GLUCOSE, INR, CALCIUM in the last 72 hours.  Invalid input(s): PT, 2   Discharge Medications:   Allergies as of 07/11/2018   No Known Allergies     Medication List    STOP taking these medications   aspirin 325 MG tablet   diclofenac 75 MG EC tablet Commonly known as:  VOLTAREN   folic acid 1 MG tablet Commonly known as:  FOLVITE   GLUCOSAMINE CHOND MSM FORMULA PO   Melatonin 10 MG Tabs   multivitamin with minerals tablet   traMADol 50 MG tablet Commonly known as:  ULTRAM     TAKE these medications   ALPRAZolam 0.5 MG tablet Commonly known as:  XANAX Take 0.5-1 mg by mouth at bedtime.   atorvastatin 40 MG tablet Commonly known as:  LIPITOR Take 40 mg by mouth daily.   benazepril-hydrochlorthiazide 20-12.5 MG tablet Commonly known as:  LOTENSIN HCT Take 1 tablet by mouth 2 (two) times  daily.   DILT-XR 180 MG 24 hr capsule Generic drug:  diltiazem TAKE 1 CAPSULE (180 MG TOTAL) DAILY BY MOUTH. What changed:  See the new instructions.   fenofibrate 160 MG tablet Take 160 mg by mouth at bedtime.   flecainide 50 MG tablet Commonly known as:  TAMBOCOR TAKE 1 TABLET BY MOUTH TWICE A DAY   levothyroxine 200 MCG tablet Commonly known as:  SYNTHROID, LEVOTHROID Take 200 mcg by mouth daily before breakfast.   methocarbamol 500 MG tablet Commonly known as:  ROBAXIN Take 1 tablet (500 mg total) by mouth every 8 (eight) hours as needed for up to 5  days for muscle spasms. What changed:    medication strength  how much to take  when to take this   methotrexate 2.5 MG tablet Commonly known as:  RHEUMATREX Take 20 mg by mouth once a week. sunday   ondansetron 4 MG tablet Commonly known as:  ZOFRAN Take 1 tablet (4 mg total) by mouth every 8 (eight) hours as needed for up to 10 days for nausea or vomiting.   OTEZLA 30 MG Tabs Generic drug:  Apremilast Take 1 tablet by mouth 2 (two) times daily.   oxyCODONE-acetaminophen 10-325 MG tablet Commonly known as:  PERCOCET Take 1 tablet by mouth every 6 (six) hours as needed for up to 5 days for pain.       Diagnostic Studies: Dg Lumbar Spine 2-3 Views  Result Date: 07/10/2018 CLINICAL DATA:  L4-5 decompression. EXAM: LUMBAR SPINE - 2-3 VIEW COMPARISON:  Radiographs of July 20, 2011. MRI of May 15, 2011. FINDINGS: Two intraoperative cross-table lateral projections were obtained of the lumbar spine. The patient is status post unilateral posterior fusion of L2-3 and L3-4 as noted on prior study. The first image demonstrates 1 surgical probe directed toward posterior elements of L4 and another directed toward posterior elements of L5. The second image demonstrates surgical probe projected over posterior elements of L5. IMPRESSION: Surgical localization as described above. These results were called by telephone at the time of interpretation on 07/10/2018 at 11:45 am to Annika in OR 4, who verbally acknowledged these results and communicated them with Dr. Shon BatonBrooks. Electronically Signed   By: Lupita RaiderJames  Green Jr, M.D.   On: 07/10/2018 11:46   Dg Lumbar Spine 1 View  Result Date: 07/10/2018 CLINICAL DATA:  Localization for L4-5 decompression. EXAM: LUMBAR SPINE - 1 VIEW COMPARISON:  None. FINDINGS: Cross-table portable radiograph demonstrates a probe directed at the L4-5 interspace. Unilateral pedicle screw construct is seen L2 through L4. IMPRESSION: L4-5 is marked. Electronically Signed    By: Elsie StainJohn T Curnes M.D.   On: 07/10/2018 12:54    Discharge Instructions    Incentive spirometry RT   Complete by:  As directed       Follow-up Information    Venita LickBrooks, Alan Riles, MD In 2 weeks.   Specialty:  Orthopedic Surgery Why:  If symptoms worsen, For suture removal, For wound re-check Contact information: [address] [place] [address] [phone]           Discharge Plan:  discharge to home with appropriate follow up and medications.     Signed: Venita LickBROOKS,Citlali Gautney D for Dr. Venita Lickahari Miquela Costabile [facility] [phone] 07/15/2018, 7:55 AM

## 2019-03-01 ENCOUNTER — Other Ambulatory Visit: Payer: Self-pay | Admitting: Cardiovascular Disease

## 2019-05-09 ENCOUNTER — Other Ambulatory Visit: Payer: Self-pay | Admitting: Cardiovascular Disease

## 2019-05-28 ENCOUNTER — Other Ambulatory Visit: Payer: Self-pay | Admitting: Cardiovascular Disease

## 2019-05-28 NOTE — Telephone Encounter (Signed)
Pt due for 12 month f/u. Please contact pt for future appointment. 

## 2019-06-27 ENCOUNTER — Encounter: Payer: Self-pay | Admitting: Cardiovascular Disease

## 2019-06-27 ENCOUNTER — Other Ambulatory Visit: Payer: Self-pay

## 2019-06-27 ENCOUNTER — Ambulatory Visit (INDEPENDENT_AMBULATORY_CARE_PROVIDER_SITE_OTHER): Payer: Managed Care, Other (non HMO) | Admitting: Cardiovascular Disease

## 2019-06-27 DIAGNOSIS — G4733 Obstructive sleep apnea (adult) (pediatric): Secondary | ICD-10-CM

## 2019-06-27 DIAGNOSIS — I480 Paroxysmal atrial fibrillation: Secondary | ICD-10-CM

## 2019-06-27 DIAGNOSIS — E782 Mixed hyperlipidemia: Secondary | ICD-10-CM

## 2019-06-27 NOTE — Patient Instructions (Signed)
Medication Instructions:  No changes *If you need a refill on your cardiac medications before your next appointment, please call your pharmacy*  Lab Work: None ordered If you have labs (blood work) drawn today and your tests are completely normal, you will receive your results only by: . MyChart Message (if you have MyChart) OR . A paper copy in the mail If you have any lab test that is abnormal or we need to change your treatment, we will call you to review the results.  Testing/Procedures: None ordered  Follow-Up: At [facility], you and your health needs are our priority.  As part of our continuing mission to provide you with exceptional heart care, we have created designated Provider Care Teams.  These Care Teams include your primary Cardiologist (physician) and Advanced Practice Providers (APPs -  Physician Assistants and Nurse Practitioners) who all work together to provide you with the care you need, when you need it.  Your next appointment:   12 month(s)  The format for your next appointment:   In Person  Provider:   Jonathan Berry, MD    

## 2019-06-27 NOTE — Assessment & Plan Note (Signed)
History of obstructive sleep apnea.  He says he wears his CPAP intermittently

## 2019-06-27 NOTE — Assessment & Plan Note (Signed)
History of essential hypertension with blood pressure measured today at 132/80.  He is on benazepril, hydrochlorothiazide and diltiazem

## 2019-06-27 NOTE — Progress Notes (Signed)
06/27/2019 ARIANNA DELSANTO   1961/01/01  [id number]  Primary Physician Kathyrn Lass, MD Primary Cardiologist: Lorretta Harp MD Lupe Carney, [place]  HPI:  Sean Miles is a 58 y.o.  married, father of 2 who works  as a Solicitor at [employer]. He was referred by Dr. Kathyrn Lass for cardiac vascular evaluation and reestablished my practice.  I last saw him in the office 07/10/2017.  He has a history of treated hypertension, hyperlipidemia and obstructive sleep apnea currently not on CPAP. He did have paroxysmal A. fib and underwent outpatient DC cardioversion in [facility] 03/23/16. He remained on oral anticoagulation for 3 months after that which was discontinued this past November by his cardiologist there. He remains on diltiazem and flecainide maintaining sinus rhythm. He's never had a heart attack or stroke. He has no other risk factors. He denies chest pain or shortness of breath.  Since I saw him 2 years ago he has seen Kerin Ransom, PA-C back in the office 05/26/2018.  He has remained stable and is asymptomatic.  He denies chest pain or shortness of breath.  He remains on flecainide and diltiazem.  He said no recurrent A. fib.  Current Meds  Medication Sig  . ALPRAZolam (XANAX) 0.5 MG tablet Take 0.5-1 mg by mouth at bedtime.   [name] Apremilast (OTEZLA) 30 MG TABS Take 1 tablet by mouth 2 (two) times daily.  [name] atorvastatin (LIPITOR) 40 MG tablet Take 40 mg by mouth daily.  . benazepril-hydrochlorthiazide (LOTENSIN HCT) 20-12.5 MG per tablet Take 1 tablet by mouth 2 (two) times daily.   [name] DILT-XR 180 MG 24 hr capsule TAKE 1 CAPSULE (180 MG TOTAL) DAILY BY MOUTH.  . fenofibrate 160 MG tablet Take 160 mg by mouth at bedtime.  . flecainide (TAMBOCOR) 50 MG tablet TAKE 1 TABLET BY MOUTH TWICE A DAY  . levothyroxine (SYNTHROID, LEVOTHROID) 200 MCG tablet Take 200 mcg by mouth daily before breakfast.   . methotrexate (RHEUMATREX) 2.5 MG tablet Take 20 mg by mouth once a week. sunday     No  Known Allergies  Social History   Socioeconomic History  . Marital status: Married    Spouse name: Not on file  . Number of children: Not on file  . Years of education: Not on file  . Highest education level: Not on file  Occupational History  . Not on file  Social Needs  . Financial resource strain: Not on file  . Food insecurity    Worry: Not on file    Inability: Not on file  . Transportation needs    Medical: Not on file    Non-medical: Not on file  Tobacco Use  . Smoking status: Former Smoker    Packs/day: 0.25    Years: 10.00    Pack years: 2.50    Types: Cigarettes    Quit date: 07/19/1985    Years since quitting: 33.9  . Smokeless tobacco: Never Used  . Tobacco comment: approx. 20 years ago  Substance and Sexual Activity  . Alcohol use: Yes    Alcohol/week: 2.0 standard drinks    Types: 2 Cans of beer per week    Comment: 2-3 times per weeks  . Drug use: No  . Sexual activity: Yes  Lifestyle  . Physical activity    Days per week: Not on file    Minutes per session: Not on file  . Stress: Not on file  Relationships  . Social  connections    Talks on phone: Not on file    Gets together: Not on file    Attends religious service: Not on file    Active member of club or organization: Not on file    Attends meetings of clubs or organizations: Not on file    Relationship status: Not on file  . Intimate partner violence    Fear of current or ex partner: Not on file    Emotionally abused: Not on file    Physically abused: Not on file    Forced sexual activity: Not on file  Other Topics Concern  . Not on file  Social History Narrative  . Not on file     Review of Systems: General: negative for chills, fever, night sweats or weight changes.  Cardiovascular: negative for chest pain, dyspnea on exertion, edema, orthopnea, palpitations, paroxysmal nocturnal dyspnea or shortness of breath Dermatological: negative for rash Respiratory: negative for cough or  wheezing Urologic: negative for hematuria Abdominal: negative for nausea, vomiting, diarrhea, bright red blood per rectum, melena, or hematemesis Neurologic: negative for visual changes, syncope, or dizziness All other systems reviewed and are otherwise negative except as noted above.    Blood pressure 132/80, pulse 79, temperature 97.7 F (36.5 C), height 5\' 10"  (1.778 m), weight 266 lb (120.7 kg).  General appearance: alert and no distress Neck: no adenopathy, no carotid bruit, no JVD, supple, symmetrical, trachea midline and thyroid not enlarged, symmetric, no tenderness/mass/nodules Lungs: clear to auscultation bilaterally Heart: regular rate and rhythm, S1, S2 normal, no murmur, click, rub or gallop Extremities: extremities normal, atraumatic, no cyanosis or edema Pulses: 2+ and symmetric Skin: Skin color, texture, turgor normal. No rashes or lesions Neurologic: Alert and oriented X 3, normal strength and tone. Normal symmetric reflexes. Normal coordination and gait  EKG sinus rhythm at 79 without ST or T wave changes.  I personally reviewed this EKG  ASSESSMENT AND PLAN:   Hyperlipidemia History of hyperlipidemia on atorvastatin followed by his PCP  HTN (hypertension) History of essential hypertension with blood pressure measured today at 132/80.  He is on benazepril, hydrochlorothiazide and diltiazem  Sleep apnea History of obstructive sleep apnea.  He says he wears his CPAP intermittently  Paroxysmal atrial fibrillation (HCC) History of paroxysmal A. fib maintaining sinus rhythm on flecainide.  I last saw him in the office 07/10/2017.  He did have DC cardioversion in [facility] 03/23/2016 and has not had a recurrence.      05/23/2016 MD FACP,FACC,FAHA, [facility] 06/27/2019 4:30 PM

## 2019-06-27 NOTE — Assessment & Plan Note (Signed)
History of hyperlipidemia on atorvastatin followed by his PCP 

## 2019-06-27 NOTE — Assessment & Plan Note (Signed)
History of paroxysmal A. fib maintaining sinus rhythm on flecainide.  I last saw him in the office 07/10/2017.  He did have DC cardioversion in [facility] 03/23/2016 and has not had a recurrence.

## 2019-08-05 ENCOUNTER — Other Ambulatory Visit: Payer: Self-pay | Admitting: Cardiovascular Disease

## 2019-08-23 ENCOUNTER — Other Ambulatory Visit: Payer: Self-pay | Admitting: Cardiovascular Disease

## 2020-01-14 ENCOUNTER — Telehealth: Payer: Self-pay

## 2020-01-14 NOTE — Telephone Encounter (Signed)
Primary Cardiologist:Jonathan Allyson Sabal, MD  Chart reviewed as part of pre-operative protocol coverage. Because of Sean Miles past medical history and time since last visit, he/she will require a follow-up visit in order to better assess preoperative cardiovascular risk.  Pre-op covering staff: - Please schedule appointment and call patient to inform them. - Please contact requesting surgeon's office via preferred method (i.e, phone, fax) to inform them of need for appointment prior to surgery.  If applicable, this message will also be routed to pharmacy pool and/or primary cardiologist for input on holding anticoagulant/antiplatelet agent as requested below so that this information is available at time of patient's appointment.   Ronney Asters, NP  01/14/2020, 2:44 PM

## 2020-01-14 NOTE — Telephone Encounter (Signed)
   [facility] Pre-operative Risk Assessment    HEARTCARE STAFF: - Please ensure there is not already an duplicate clearance open for this procedure. - Under Visit Info/Reason for Call, type in Other and utilize the format Clearance MM/DD/YY or Clearance TBD. Do not use dashes or single digits. - If request is for dental extraction, please clarify the # of teeth to be extracted.  Request for surgical clearance:  1. What type of surgery is being performed? LEFT TOTAL ARTHROPLASTY   2. When is this surgery scheduled? 03-02-2020   3. What type of clearance is required (medical clearance vs. Pharmacy clearance to hold med vs. Both)? BOTH  4. Are there any medications that need to be held prior to surgery and how long? ASA    5. Practice name and name of physician performing surgery? [facility] ATTN:KELLY   6. What is the office phone number?  [phone]   7.   What is the office fax number?   [phone]  4.   Anesthesia type (None, local, MAC, general) ? CHOICE

## 2020-01-14 NOTE — Telephone Encounter (Signed)
THIS IS A LEFT TOTAL HIP ARTHROPLASTY. [id number] NEEDS APPT FOR PRE-OP

## 2020-01-15 NOTE — Telephone Encounter (Signed)
Pt has appt scheduled w/Berry 01/23/2020@3 :45 PM

## 2020-01-23 ENCOUNTER — Other Ambulatory Visit: Payer: Self-pay

## 2020-01-23 ENCOUNTER — Encounter: Payer: Self-pay | Admitting: Cardiovascular Disease

## 2020-01-23 ENCOUNTER — Ambulatory Visit (INDEPENDENT_AMBULATORY_CARE_PROVIDER_SITE_OTHER): Payer: Managed Care, Other (non HMO) | Admitting: Cardiovascular Disease

## 2020-01-23 VITALS — BP 154/84 | HR 71 | Ht 70.0 in | Wt 263.2 lb

## 2020-01-23 DIAGNOSIS — I10 Essential (primary) hypertension: Secondary | ICD-10-CM

## 2020-01-23 DIAGNOSIS — E782 Mixed hyperlipidemia: Secondary | ICD-10-CM | POA: Diagnosis not present

## 2020-01-23 DIAGNOSIS — Z01810 Encounter for preprocedural cardiovascular examination: Secondary | ICD-10-CM | POA: Diagnosis not present

## 2020-01-23 DIAGNOSIS — G4733 Obstructive sleep apnea (adult) (pediatric): Secondary | ICD-10-CM

## 2020-01-23 DIAGNOSIS — I480 Paroxysmal atrial fibrillation: Secondary | ICD-10-CM

## 2020-01-23 NOTE — Assessment & Plan Note (Signed)
History of paroxysmal atrial fib status post cardioversion August 2017 currently on flecainide and not on oral anticoagulation.  He said no recurrent episodes since.

## 2020-01-23 NOTE — Addendum Note (Signed)
Addended by: Lindell Spar on: 01/23/2020 04:07 PM   Modules accepted: Orders

## 2020-01-23 NOTE — Progress Notes (Signed)
01/23/2020 Sean Miles   [birth year]  [id number]  Primary Physician Sigmund Hazel, MD Primary Cardiologist: Runell Gess MD [name], [name]  HPI:  Sean Miles is a 59 y.o.  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 Sigmund Hazel for cardiac vascular evaluation and reestablished my practice.  I last saw him in the office 06/27/2019.  He has a history of treated hypertension, hyperlipidemia and obstructive sleep apnea currently not on CPAP. He did have paroxysmal A. fib and underwent outpatient DC cardioversion in [facility] 03/23/16. He remained on oral anticoagulation for 3 months after that which was discontinued this past November by his cardiologist there. He remains on diltiazem and flecainide maintaining sinus rhythm. He's never had a heart attack or stroke. He has no other risk factors. He denies chest pain or shortness of breath.  Since I saw him in the office 6 months ago he continues to do well.  He remains on flecainide.  He said no further episodes of A. fib.  He is scheduled to have a left total hip replacement via the anterior approach as an outpatient by Dr. Lequita Halt 03/02/2020.  He also has had left shoulder replacement, bilateral knee replacement and metal in his back.  He said no issues with prior surgeries.  Denies chest pain or shortness of breath.    Current Meds  Medication Sig  . ALPRAZolam (XANAX) 1 MG tablet Take 1 mg by mouth daily as needed.  [name] Apremilast (OTEZLA) 30 MG TABS Take 1 tablet by mouth 2 (two) times daily.  [name] atorvastatin (LIPITOR) 40 MG tablet Take 40 mg by mouth daily.  . benazepril-hydrochlorthiazide (LOTENSIN HCT) 20-12.5 MG per tablet Take 1 tablet by mouth 2 (two) times daily.   . diclofenac (VOLTAREN) 75 MG EC tablet Take 75 mg by mouth 2 (two) times daily.  [name] DILT-XR 180 MG 24 hr capsule TAKE 1 CAPSULE (180 MG TOTAL) DAILY BY MOUTH.  . fenofibrate 160 MG tablet Take 160 mg by mouth at bedtime.   . ferrous sulfate 325 (65 FE) MG tablet ferrous sulfate 325 mg (65 mg iron) tablet  Take 1 tablet every day by oral route.  . flecainide (TAMBOCOR) 50 MG tablet TAKE 1 TABLET BY MOUTH TWICE A DAY  . folic acid (FOLVITE) 1 MG tablet Take 1 mg by mouth 2 (two) times daily.  [name] levothyroxine (SYNTHROID, LEVOTHROID) 200 MCG tablet Take 200 mcg by mouth daily before breakfast.   . lisinopril-hydrochlorothiazide (ZESTORETIC) 20-12.5 MG tablet Take 1 tablet by mouth daily.  . methocarbamol (ROBAXIN) 750 MG tablet Take 750 mg by mouth 3 (three) times daily.  . methotrexate (RHEUMATREX) 2.5 MG tablet Take 20 mg by mouth once a week. sunday  . traMADol (ULTRAM) 50 MG tablet Take 100 mg by mouth 4 (four) times daily as needed.     No Known Allergies  Social History   Socioeconomic History  . Marital status: Married    Spouse name: Not on file  . Number of children: Not on file  . Years of education: Not on file  . Highest education level: Not on file  Occupational History  . Not on file  Tobacco Use  . Smoking status: Former Smoker    Packs/day: 0.25    Years: 10.00    Pack years: 2.50    Types: Cigarettes    Quit date: 07/19/1985    Years since quitting: 34.5  .  Smokeless tobacco: Never Used  . Tobacco comment: approx. 20 years ago  Substance and Sexual Activity  . Alcohol use: Yes    Alcohol/week: 2.0 standard drinks    Types: 2 Cans of beer per week    Comment: 2-3 times per weeks  . Drug use: No  . Sexual activity: Yes  Other Topics Concern  . Not on file  Social History Narrative  . Not on file   Social Determinants of Health   Financial Resource Strain:   . Difficulty of Paying Living Expenses:   Food Insecurity:   . Worried About Charity fundraiser in the Last Year:   . Arboriculturist in the Last Year:   Transportation Needs:   . Film/video editor (Medical):   [name] Lack of Transportation (Non-Medical):   Physical Activity:   . Days of Exercise per Week:   .  Minutes of Exercise per Session:   Stress:   . Feeling of Stress :   Social Connections:   . Frequency of Communication with Friends and Family:   . Frequency of Social Gatherings with Friends and Family:   . Attends Religious Services:   . Active Member of Clubs or Organizations:   . Attends Archivist Meetings:   [name] Marital Status:   Intimate Partner Violence:   . Fear of Current or Ex-Partner:   . Emotionally Abused:   [name] Physically Abused:   . Sexually Abused:      Review of Systems: General: negative for chills, fever, night sweats or weight changes.  Cardiovascular: negative for chest pain, dyspnea on exertion, edema, orthopnea, palpitations, paroxysmal nocturnal dyspnea or shortness of breath Dermatological: negative for rash Respiratory: negative for cough or wheezing Urologic: negative for hematuria Abdominal: negative for nausea, vomiting, diarrhea, bright red blood per rectum, melena, or hematemesis Neurologic: negative for visual changes, syncope, or dizziness All other systems reviewed and are otherwise negative except as noted above.    Blood pressure (!) 154/84, pulse 71, height 5\' 10"  (1.778 m), weight 263 lb 3.2 oz (119.4 kg), SpO2 98 %.  General appearance: alert and no distress Neck: no adenopathy, no carotid bruit, no JVD, supple, symmetrical, trachea midline and thyroid not enlarged, symmetric, no tenderness/mass/nodules Lungs: clear to auscultation bilaterally Heart: regular rate and rhythm, S1, S2 normal, no murmur, click, rub or gallop Extremities: extremities normal, atraumatic, no cyanosis or edema Pulses: 2+ and symmetric Skin: Skin color, texture, turgor normal. No rashes or lesions Neurologic: Alert and oriented X 3, normal strength and tone. Normal symmetric reflexes. Normal coordination and gait  EKG normal sinus rhythm at 71 without ST or T wave changes.  I personally reviewed this EKG.  ASSESSMENT AND PLAN:   Hyperlipidemia History  of hyperlipidemia on statin therapy with lipid profile performed 01/01/2020 revealing total cholesterol 157, LDL 92 and HDL of 51.  HTN (hypertension) History of essential hypertension with blood pressure measured today 154/84.  He is on Lotensin, hydrochlorothiazide, and diltiazem  Sleep apnea History of obstructive sleep apnea not on CPAP  Paroxysmal atrial fibrillation (HCC) History of paroxysmal atrial fib status post cardioversion August 2017 currently on flecainide and not on oral anticoagulation.  He said no recurrent episodes since.      Lorretta Harp MD FACP,FACC,FAHA, [facility] 01/23/2020 4:04 PM

## 2020-01-23 NOTE — Assessment & Plan Note (Signed)
History of hyperlipidemia on statin therapy with lipid profile performed 01/01/2020 revealing total cholesterol 157, LDL 92 and HDL of 51.

## 2020-01-23 NOTE — Patient Instructions (Signed)
Medication Instructions:  Your physician recommends that you continue on your current medications as directed. Please refer to the Current Medication list given to you today.  *If you need a refill on your cardiac medications before your next appointment, please call your pharmacy*   Follow-Up: At [facility], you and your health needs are our priority.  As part of our continuing mission to provide you with exceptional heart care, we have created designated Provider Care Teams.  These Care Teams include your primary Cardiologist (physician) and Advanced Practice Providers (APPs -  Physician Assistants and Nurse Practitioners) who all work together to provide you with the care you need, when you need it.  We recommend signing up for the patient portal called "MyChart".  Sign up information is provided on this After Visit Summary.  MyChart is used to connect with patients for Virtual Visits (Telemedicine).  Patients are able to view lab/test results, encounter notes, upcoming appointments, etc.  Non-urgent messages can be sent to your provider as well.   To learn more about what you can do with MyChart, go to [email or web address].    Your next appointment:   12 month(s)  The format for your next appointment:   In Person  Provider:   You may see Jonathan Berry, MD or one of the following Advanced Practice Providers on your designated Care Team:    Luke Kilroy, PA-C  Callie Goodrich, PA-C  Jesse Cleaver, FNP    Other Instructions   

## 2020-01-23 NOTE — Assessment & Plan Note (Signed)
History of essential hypertension with blood pressure measured today 154/84.  He is on Lotensin, hydrochlorothiazide, and diltiazem

## 2020-01-23 NOTE — Assessment & Plan Note (Signed)
History of obstructive sleep apnea not on CPAP. 

## 2020-03-23 ENCOUNTER — Emergency Department (HOSPITAL_COMMUNITY)
Admission: EM | Admit: 2020-03-23 | Discharge: 2020-03-23 | Disposition: A | Discharge status: Home / Self Care | Payer: Managed Care, Other (non HMO) | Attending: Emergency Medicine | Admitting: Emergency Medicine

## 2020-03-23 ENCOUNTER — Encounter (HOSPITAL_COMMUNITY): Payer: Self-pay | Admitting: Emergency Medicine

## 2020-03-23 ENCOUNTER — Emergency Department (HOSPITAL_COMMUNITY): Payer: Managed Care, Other (non HMO)

## 2020-03-23 ENCOUNTER — Other Ambulatory Visit: Payer: Self-pay

## 2020-03-23 DIAGNOSIS — R52 Pain, unspecified: Secondary | ICD-10-CM

## 2020-03-23 DIAGNOSIS — Y793 Surgical instruments, materials and orthopedic devices (including sutures) associated with adverse incidents: Secondary | ICD-10-CM | POA: Diagnosis not present

## 2020-03-23 DIAGNOSIS — S73005A Unspecified dislocation of left hip, initial encounter: Secondary | ICD-10-CM

## 2020-03-23 DIAGNOSIS — T84021A Dislocation of internal left hip prosthesis, initial encounter: Secondary | ICD-10-CM | POA: Insufficient documentation

## 2020-03-23 MED ORDER — ONDANSETRON HCL 4 MG/2ML IJ SOLN
4.0000 mg | Freq: Once | INTRAMUSCULAR | Status: AC
  Administered 2020-03-23: 4 mg via INTRAVENOUS
  Filled 2020-03-23: qty 2

## 2020-03-23 MED ORDER — HYDROMORPHONE HCL 1 MG/ML IJ SOLN
1.0000 mg | Freq: Once | INTRAMUSCULAR | Status: AC
  Administered 2020-03-23: 1 mg via INTRAVENOUS
  Filled 2020-03-23: qty 1

## 2020-03-23 MED ORDER — OXYCODONE-ACETAMINOPHEN 5-325 MG PO TABS: 1.0000 | ORAL_TABLET | Freq: Four times a day (QID) | ORAL | 0 refills | Status: DC | PRN

## 2020-03-23 MED ORDER — PROPOFOL 10 MG/ML IV BOLUS
INTRAVENOUS | Status: AC | PRN
  Administered 2020-03-23 (×2): 25 mg via INTRAVENOUS
  Administered 2020-03-23: 100 mg via INTRAVENOUS

## 2020-03-23 MED ORDER — PROPOFOL 10 MG/ML IV BOLUS
1.0000 mg/kg | Freq: Once | INTRAVENOUS | Status: DC
  Filled 2020-03-23: qty 20

## 2020-03-23 NOTE — ED Provider Notes (Signed)
Care transferred to me.  Patient's post reduction films appear well.  Successful reduction.  Patient will be discharged home to follow-up with orthopedics.   Pricilla Loveless, MD 03/23/20 1600

## 2020-03-23 NOTE — ED Notes (Signed)
Pt has knee immobilizer on and was helped into paper scrubs.  Pt was able to stand without difficulty

## 2020-03-23 NOTE — Progress Notes (Signed)
CHaplain providing brief support at bedside.  Referred by pt spouse, an employee of [employer], who was awaiting pt arrival at ED

## 2020-03-23 NOTE — ED Notes (Signed)
Signed Procedural Consent is in chart.

## 2020-03-23 NOTE — ED Provider Notes (Addendum)
[facility]  CSN: [id number] Arrival date & time: 03/23/20 1252    History Chief Complaint  Patient presents with  . Hip Injury    HPI  Sean Miles is a 59 y.o. male who is about 3 weeks post L total hip replacement. He was at work today bending over when he had sudden severe pain in his hip and his leg gave out. EMS was called, they report when supine he was internally rotated, concerning for dislocation. On arrival here he is in right decubitus position. Fentanyl and ketamine given by EMS ineffective at pain control.    Past Medical History:  Diagnosis Date  . Anxiety    takes xanax 1- 2 times per day  . Arthritis    psoriatic  . Complication of anesthesia    post anesthesia low sat rates  . Hypercholesteremia   . Hypertension    followed by [name] (primary)  . Hypothyroidism    takes armour thyroid  . Pneumonia   . Psoriatic arthritis (HCC)   . Sleep apnea    approx. 3 years ago, unsure of where it was done USES C PAP    Past Surgical History:  Procedure Laterality Date  . ANTERIOR LAT LUMBAR FUSION  07/19/2011   Procedure: ANTERIOR LATERAL LUMBAR FUSION 2 LEVELS;  Surgeon: [name];  Location: [facility] OR;  Service: Orthopedics;  Laterality: N/A;  Xlif and Posterior Spinal Fusion interbody L2-L4  . EYE SURGERY     lasik  . HAND SURGERY     right hand removal of growth 1991  . JOINT REPLACEMENT     right total knee, 2006 , left knee  2015  . LUMBAR FUSION  07/19/2011   lateral lumbar fusion   . LUMBAR LAMINECTOMY/DECOMPRESSION MICRODISCECTOMY N/A 07/10/2018   Procedure: L4-5 decompression and excision of cyst;  Surgeon: Venita LickBrooks, Dahari, MD;  [facility];  Service: Orthopedics;  Laterality: N/A;  3 hrs  . REVERSE SHOULDER ARTHROPLASTY Left 07/31/2014   Procedure: LEFT REVERSE SHOULDER ARTHROPLASTY;  Surgeon: Verlee RossettiSteven R Norris, MD;  [facility];  Service: Orthopedics;  Laterality: Left;  . ROTATOR CUFF REPAIR      left, august 2011  . TONSILLECTOMY    . TOTAL KNEE ARTHROPLASTY Left 06/08/2014   Procedure: LEFT TOTAL KNEE ARTHROPLASTY;  Surgeon: [name], MD;  Location: [facility] ORS;  Service: Orthopedics;  Laterality: Left;    Family History  Problem Relation Age of Onset  . Hypertension Father   . Diabetes Father   . Stroke Father   . COPD Mother   . Heart failure Mother   . Coronary artery disease Brother     Social History   Tobacco Use  . Smoking status: Former Smoker    Packs/day: 0.25    Years: 10.00    Pack years: 2.50    Types: Cigarettes    Quit date: 07/19/1985    Years since quitting: 34.7  . Smokeless tobacco: Never Used  . Tobacco comment: approx. 20 years ago  Vaping Use  . Vaping Use: Never used  Substance Use Topics  . Alcohol use: Yes    Alcohol/week: 2.0 standard drinks    Types: 2 Cans of beer per week    Comment: 2-3 times per weeks  . Drug use: No     Home Medications Prior to Admission medications   Medication Sig Start Date End Date Taking? Authorizing Provider  ALPRAZolam Prudy Feeler(XANAX) 1 MG tablet Take  1 mg by mouth daily as needed. 01/10/20   [provider]  Apremilast (OTEZLA) 30 MG TABS Take 1 tablet by mouth 2 (two) times daily.    [provider]  atorvastatin (LIPITOR) 40 MG tablet Take 40 mg by mouth daily.    [provider]  diclofenac (VOLTAREN) 75 MG EC tablet Take 75 mg by mouth 2 (two) times daily. 01/10/20   [provider]  DILT-XR 180 MG 24 hr capsule TAKE 1 CAPSULE (180 MG TOTAL) DAILY BY MOUTH. 08/26/19   Runell Gess, MD  fenofibrate 160 MG tablet Take 160 mg by mouth at bedtime.    [provider]  ferrous sulfate 325 (65 FE) MG tablet ferrous sulfate 325 mg (65 mg iron) tablet  Take 1 tablet every day by oral route.    [provider]  flecainide (TAMBOCOR) 50 MG tablet TAKE 1 TABLET BY MOUTH TWICE A DAY 08/05/19   Runell Gess, MD  folic acid (FOLVITE) 1 MG tablet Take 1 mg  by mouth 2 (two) times daily. 01/13/20   [provider]  levothyroxine (SYNTHROID, LEVOTHROID) 200 MCG tablet Take 200 mcg by mouth daily before breakfast.  06/22/17   [provider]  lisinopril-hydrochlorothiazide (ZESTORETIC) 20-12.5 MG tablet Take 1 tablet by mouth daily. 08/31/19   [provider]  methocarbamol (ROBAXIN) 750 MG tablet Take 750 mg by mouth 3 (three) times daily. 01/16/20   [provider]  methotrexate (RHEUMATREX) 2.5 MG tablet Take 20 mg by mouth once a week. sunday 04/20/14   [provider]  oxyCODONE-acetaminophen (PERCOCET/ROXICET) 5-325 MG tablet Take 1-2 tablets by mouth every 6 (six) hours as needed for severe pain. 03/23/20   Pollyann Savoy, MD     Allergies    Patient has no known allergies.   Review of Systems   Review of Systems  Unable to perform ROS: Acuity of condition     Physical Exam BP (!) 167/68   Pulse 85   Temp 98 F (36.7 C) (Oral)   Resp 13   Ht 5\' 10"  (1.778 m)   Wt 119.4 kg   SpO2 97%   BMI 37.77 kg/m   Physical Exam Vitals and nursing note reviewed.  HENT:     Head: Normocephalic.     Nose: Nose normal.  Eyes:     Extraocular Movements: Extraocular movements intact.  Pulmonary:     Effort: Pulmonary effort is normal.  Musculoskeletal:        General: Tenderness (L lateral hip, NVI) present.     Cervical back: Neck supple.  Skin:    Findings: No rash (on exposed skin).  Neurological:     Mental Status: He is alert and oriented to person, place, and time.  Psychiatric:        Mood and Affect: Mood normal.      ED Results / Procedures / Treatments   Labs (all labs ordered are listed, but only abnormal results are displayed) Labs Reviewed - No data to display  EKG None  Radiology DG Hip Unilat W or Wo Pelvis 2-3 Views Left  Result Date: 03/23/2020 CLINICAL DATA:  Hip dislocation EXAM: DG HIP (WITH OR WITHOUT PELVIS) 2-3V LEFT COMPARISON:  None. FINDINGS: There is  superior and posterior dislocation of the LEFT hip prosthesis. Patient is status post LEFT hip arthroplasty. No additional acute fracture. Osteopenia. IMPRESSION: Superior and posterior dislocation of the LEFT hip prosthesis. Electronically Signed   By: 05/23/2020  MD   On: 03/23/2020 14:10    Procedures .Ortho Injury Treatment  Date/Time: 03/23/2020 3:25 PM Performed by: Pollyann Savoy, MD Authorized by: Pollyann Savoy, MD   Consent:    Consent obtained:  Verbal   Consent given by:  Patient   Risks discussed:  Fracture and recurrent dislocation   Alternatives discussed:  No treatment and delayed treatmentInjury location: hip Location details: left hip Injury type: dislocation Dislocation type: posterior Spontaneous dislocation: yes Prosthesis: yes Pre-procedure neurovascular assessment: neurovascularly intact Pre-procedure distal perfusion: normal Pre-procedure neurological function: normal Pre-procedure range of motion: reduced  Anesthesia: Local anesthesia used: no  Patient sedated: Yes. Refer to sedation procedure documentation for details of sedation. Manipulation performed: yes Reduction method: traction and counter traction Reduction successful: yes Immobilization: knee immobilizer. Post-procedure neurovascular assessment: post-procedure neurovascularly intact Post-procedure distal perfusion: normal Post-procedure neurological function: normal Post-procedure range of motion: unchanged Patient tolerance: patient tolerated the procedure well with no immediate complications  .Sedation  Date/Time: 03/23/2020 3:26 PM Performed by: Pollyann Savoy, MD Authorized by: Pollyann Savoy, MD   Consent:    Consent obtained:  Verbal   Consent given by:  Patient   Risks discussed:  Prolonged hypoxia resulting in organ damage, prolonged sedation necessitating reversal and respiratory compromise necessitating ventilatory assistance and intubation   Alternatives  discussed:  Analgesia without sedation Universal protocol:    Immediately prior to procedure a time out was called: yes     Patient identity confirmation method:  Arm band Indications:    Procedure performed:  Dislocation reduction   Procedure necessitating sedation performed by:  Physician performing sedation Pre-sedation assessment:    Time since last food or drink:  3   ASA classification: class 2 - patient with mild systemic disease     Neck mobility: normal     Mouth opening:  3 or more finger widths   Mallampati score:  II - soft palate, uvula, fauces visible   Pre-sedation assessments completed and reviewed: airway patency, cardiovascular function, mental status, nausea/vomiting, pain level and respiratory function   Immediate pre-procedure details:    Reviewed: vital signs   Procedure details (see MAR for exact dosages):    Preoxygenation:  Nasal cannula   Sedation:  Propofol   Intended level of sedation: deep   Analgesia:  Hydromorphone   Intra-procedure monitoring:  Blood pressure monitoring, cardiac monitor, continuous capnometry, continuous pulse oximetry and frequent vital sign checks   Intra-procedure events: none     Total Provider sedation time (minutes):  10 Post-procedure details:    Post-sedation assessment completed:  03/23/2020 3:27 PM   Attendance: Constant attendance by certified staff until patient recovered     Recovery: Patient returned to pre-procedure baseline     Post-sedation assessments completed and reviewed: airway patency, cardiovascular function, mental status and nausea/vomiting     Patient tolerance:  Tolerated well, no immediate complications    Medications Ordered in the ED Medications  propofol (DIPRIVAN) 10 mg/mL bolus/IV push 119.4 mg (119.4 mg Intravenous Not Given 03/23/20 1527)  HYDROmorphone (DILAUDID) injection 1 mg (1 mg Intravenous Given 03/23/20 1321)  ondansetron (ZOFRAN) injection 4 mg (4 mg Intravenous Given 03/23/20 1321)    HYDROmorphone (DILAUDID) injection 1 mg (1 mg Intravenous Given 03/23/20 1346)  HYDROmorphone (DILAUDID) injection 1 mg (1 mg Intravenous Given 03/23/20 1435)  propofol (DIPRIVAN) 10 mg/mL bolus/IV push (25 mg Intravenous Given 03/23/20 1514)     MDM Rules/Calculators/A&P MDM Patient with symptoms concerning for hip dislocation. Dilaudid ordered  for pain to facilitate imaging.  ED Course  I have reviewed the triage vital signs and the nursing notes.  Pertinent labs & imaging results that were available during my care of the patient were reviewed by me and considered in my medical decision making (see chart for details).  Clinical Course as of Mar 23 1537  Tue Mar 23, 2020  1430 Xray images and results reviewed. Will page Ortho to discuss, surgery done by Dr. Lequita Halt, Hollie Salk. Patient and wife informed of plan.    [CS]  1445 Spoke with Dr. Victorino Dike, Ortho, who requested we attempt reduction in the ED. Patient amenable.    [CS]  1532 Hip reduction appears successful with good reduction by feel. Pending confirmatory xray. Patient awake, alert, pain improved. In knee immobilizer, plan discharge with mobility restrictions and Ortho followup. Rx for oxycodone if needed.    [CS]  1534 Care of the patient signed over to Dr. Criss Alvine at shift change pending xray read and waking up.    [CS]    Clinical Course User Index [CS] Pollyann Savoy, MD    Final Clinical Impression(s) / ED Diagnoses Final diagnoses:  Dislocation of left hip, initial encounter [facility])    Rx / DC Orders ED Discharge Orders         Ordered    oxyCODONE-acetaminophen (PERCOCET/ROXICET) 5-325 MG tablet  Every 6 hours PRN     Discontinue  Reprint     03/23/20 1532           Pollyann Savoy, MD 03/23/20 1533    Pollyann Savoy, MD 03/23/20 1538

## 2020-03-23 NOTE — ED Triage Notes (Signed)
Patient arrived by EMS from work. Patient was bending down when pain began. Patient fell to the ground.   Patient had hip surgery 3 weeks ago (hip replacement of the LFT side).   EMS gave 100 mcg of fentanyl and 20 mg of Ketamine.

## 2020-04-06 NOTE — Progress Notes (Addendum)
Anesthesia Review:  PCP: DR Sigmund Hazel Clearance dated 02/11/20 on chart  Cardiologist : DR Nanetta Batty-  LOV- 01/23/20 on chart  Chest x-ray : EKG :01/23/20 on chart  Echo : Stress test: Cardiac Cath :  Activity level: can do a flight of steps without difficulty  Sleep Study/ CPAP : Fasting Blood Sugar :      / Checks Blood Sugar -- times a day:   Blood Thinner/ Instructions /Last Dose: ASA / Instructions/ Last Dose :

## 2020-04-06 NOTE — Progress Notes (Signed)
Marland KitchenMarland Kitchen.... DUE TO COVID-19 ONLY ONE VISITOR IS ALLOWED TO COME WITH YOU AND STAY IN THE WAITING ROOM ONLY DURING PRE OP AND PROCEDURE DAY OF SURGERY. THE 1 VISITOR  MAY VISIT WITH YOU AFTER SURGERY IN YOUR PRIVATE ROOM DURING VISITING HOURS ONLY!  YOU NEED TO HAVE A COVID 19 TEST ON_______ @_______ , THIS TEST MUST BE DONE BEFORE SURGERY,  COVID TESTING SITE [address] [place] , IT IS ON THE RIGHT GOING OUT [address] APPROXIMATELY  2 MINUTES PAST [employer] ON THE RIGHT. ONCE YOUR COVID TEST IS COMPLETED,  PLEASE BEGIN THE QUARANTINE INSTRUCTIONS AS OUTLINED IN YOUR HANDOUT.                MELVILLE ENGEN  04/06/2020   Your procedure is scheduled on:   04/12/2020   Report to [facility] Main  Entrance   Report to admitting at     1010 AM     Call this number if you have problems the morning of surgery [phone]    Remember: Do not eat food   :After Midnight. BRUSH YOUR TEETH MORNING OF SURGERY AND RINSE YOUR MOUTH OUT, NO CHEWING GUM CANDY OR MINTS.  NO SOLID FOOD AFTER MIDNIGHT THE NIGHT PRIOR TO SURGERY. NOTHING BY MOUTH EXCEPT CLEAR LIQUIDS UNTIL .  0940AM PLEASE FINISH ENSURE DRINK PER SURGEON ORDER  WHICH NEEDS TO BE COMPLETED AT .07-31-1998    CLEAR LIQUID DIET   Foods Allowe  Coffee and tea, regular and decaf                          Plain Jell-O any favor except red or purple      Fruit ices (not with fruit pulp)                                      Iced Popsicles                                   Carbonated beverages, regular and diet                                    Cranberry, grape and apple juices Sports drinks like Gatorade Lightly seasoned clear broth or consume(fat free) Sugar, honey syrup                                                                                                                                                     _____________________________________________________________________    Take  these medicines the morning of surgery with A  SIP OF WATER:     dILT-xr, tAMBOCOR, sYNTHROID                                 You may not have any metal on your body including hair pins and              piercings  Do not wear jewelry, make-up, lotions, powders or perfumes, deodorant             Do not wear nail polish on your fingernails.  Do not shave  48 hours prior to surgery.              Men may shave face and neck.   Do not bring valuables to the hospital. [place] [facility] IS NOT             RESPONSIBLE   FOR VALUABLES.  Contacts, dentures or bridgework may not be worn into surgery.  Leave suitcase in the car. After surgery it may be brought to your room.     Patients discharged the day of surgery will not be allowed to drive home. IF YOU ARE HAVING SURGERY AND GOING HOME THE SAME DAY, YOU MUST HAVE AN ADULT TO DRIVE YOU HOME AND BE WITH YOU FOR 24 HOURS. YOU MAY GO HOME BY TAXI OR UBER OR ORTHERWISE, BUT AN ADULT MUST ACCOMPANY YOU HOME AND STAY WITH YOU FOR 24 HOURS.  Name and phone number of your driver:  Special Instructions: N/A              Please read over the following fact sheets you were given: _____________________________________________________________________  [facility] - Preparing for Surgery Before surgery, you can play an important role.  Because skin is not sterile, your skin needs to be as free of germs as possible.  You can reduce the number of germs on your skin by washing with CHG (chlorahexidine gluconate) soap before surgery.  CHG is an antiseptic cleaner which kills germs and bonds with the skin to continue killing germs even after washing. Please DO NOT use if you have an allergy to CHG or antibacterial soaps.  If your skin becomes reddened/irritated stop using the CHG and inform your nurse when you arrive at Short Stay. Do not shave (including legs and underarms) for at least 48 hours prior to the first CHG shower.  You may shave your face/neck. Please follow  these instructions carefully:  1.  Shower with CHG Soap the night before surgery and the  morning of Surgery.  2.  If you choose to wash your hair, wash your hair first as usual with your  normal  shampoo.  3.  After you shampoo, rinse your hair and body thoroughly to remove the  shampoo.                           4.  Use CHG as you would any other liquid soap.  You can apply chg directly  to the skin and wash                       Gently with a scrungie or clean washcloth.  5.  Apply the CHG Soap to your body ONLY FROM THE NECK DOWN.   Do not use on face/ open  Wound or open sores. Avoid contact with eyes, ears mouth and genitals (private parts).                       Wash face,  Genitals (private parts) with your normal soap.             6.  Wash thoroughly, paying special attention to the area where your surgery  will be performed.  7.  Thoroughly rinse your body with warm water from the neck down.  8.  DO NOT shower/wash with your normal soap after using and rinsing off  the CHG Soap.                9.  Pat yourself dry with a clean towel.            10.  Wear clean pajamas.            11.  Place clean sheets on your bed the night of your first shower and do not  sleep with pets. Day of Surgery : Do not apply any lotions/deodorants the morning of surgery.  Please wear clean clothes to the hospital/surgery center.  FAILURE TO FOLLOW THESE INSTRUCTIONS MAY RESULT IN THE CANCELLATION OF YOUR SURGERY PATIENT SIGNATURE_________________________________  NURSE SIGNATURE__________________________________  ________________________________________________________________________   Adam Phenix  An incentive spirometer is a tool that can help keep your lungs clear and active. This tool measures how well you are filling your lungs with each breath. Taking long deep breaths may help reverse or decrease the chance of developing breathing (pulmonary) problems  (especially infection) following:  A long period of time when you are unable to move or be active. BEFORE THE PROCEDURE   If the spirometer includes an indicator to show your best effort, your nurse or respiratory therapist will set it to a desired goal.  If possible, sit up straight or lean slightly forward. Try not to slouch.  Hold the incentive spirometer in an upright position. INSTRUCTIONS FOR USE  1. Sit on the edge of your bed if possible, or sit up as far as you can in bed or on a chair. 2. Hold the incentive spirometer in an upright position. 3. Breathe out normally. 4. Place the mouthpiece in your mouth and seal your lips tightly around it. 5. Breathe in slowly and as deeply as possible, raising the piston or the ball toward the top of the column. 6. Hold your breath for 3-5 seconds or for as long as possible. Allow the piston or ball to fall to the bottom of the column. 7. Remove the mouthpiece from your mouth and breathe out normally. 8. Rest for a few seconds and repeat Steps 1 through 7 at least 10 times every 1-2 hours when you are awake. Take your time and take a few normal breaths between deep breaths. 9. The spirometer may include an indicator to show your best effort. Use the indicator as a goal to work toward during each repetition. 10. After each set of 10 deep breaths, practice coughing to be sure your lungs are clear. If you have an incision (the cut made at the time of surgery), support your incision when coughing by placing a pillow or rolled up towels firmly against it. Once you are able to get out of bed, walk around indoors and cough well. You may stop using the incentive spirometer when instructed by your caregiver.  RISKS AND COMPLICATIONS  Take your time so you do not get  dizzy or light-headed.  If you are in pain, you may need to take or ask for pain medication before doing incentive spirometry. It is harder to take a deep breath if you are having  pain. AFTER USE  Rest and breathe slowly and easily.  It can be helpful to keep track of a log of your progress. Your caregiver can provide you with a simple table to help with this. If you are using the spirometer at home, follow these instructions: [place] IF:   You are having difficultly using the spirometer.  You have trouble using the spirometer as often as instructed.  Your pain medication is not giving enough relief while using the spirometer.  You develop fever of 100.5 F (38.1 C) or higher. SEEK IMMEDIATE MEDICAL CARE IF:   You cough up bloody sputum that had not been present before.  You develop fever of 102 F (38.9 C) or greater.  You develop worsening pain at or near the incision site. MAKE SURE YOU:   Understand these instructions.  Will watch your condition.  Will get help right away if you are not doing well or get worse. Document Released: 12/18/2006 Document Revised: 10/30/2011 Document Reviewed: 02/18/2007 [facility] Patient Information 2014 [place], [place].   ________________________________________________________________________

## 2020-04-07 ENCOUNTER — Encounter (HOSPITAL_COMMUNITY)
Admission: RE | Admit: 2020-04-07 | Discharge: 2020-04-07 | Disposition: A | Discharge status: Home / Self Care | Payer: Managed Care, Other (non HMO) | Source: Ambulatory Visit | Attending: Orthopedic Surgery | Admitting: Orthopedic Surgery

## 2020-04-07 ENCOUNTER — Encounter (HOSPITAL_COMMUNITY): Payer: Self-pay

## 2020-04-07 ENCOUNTER — Other Ambulatory Visit: Payer: Self-pay

## 2020-04-07 DIAGNOSIS — Z7982 Long term (current) use of aspirin: Secondary | ICD-10-CM | POA: Insufficient documentation

## 2020-04-07 DIAGNOSIS — T84021A Dislocation of internal left hip prosthesis, initial encounter: Secondary | ICD-10-CM | POA: Diagnosis not present

## 2020-04-07 DIAGNOSIS — Z01812 Encounter for preprocedural laboratory examination: Secondary | ICD-10-CM | POA: Diagnosis present

## 2020-04-07 DIAGNOSIS — Z7901 Long term (current) use of anticoagulants: Secondary | ICD-10-CM | POA: Insufficient documentation

## 2020-04-07 DIAGNOSIS — I10 Essential (primary) hypertension: Secondary | ICD-10-CM | POA: Diagnosis not present

## 2020-04-07 DIAGNOSIS — Z87891 Personal history of nicotine dependence: Secondary | ICD-10-CM | POA: Insufficient documentation

## 2020-04-07 DIAGNOSIS — I4891 Unspecified atrial fibrillation: Secondary | ICD-10-CM | POA: Insufficient documentation

## 2020-04-07 DIAGNOSIS — Z79899 Other long term (current) drug therapy: Secondary | ICD-10-CM | POA: Diagnosis not present

## 2020-04-07 DIAGNOSIS — F419 Anxiety disorder, unspecified: Secondary | ICD-10-CM | POA: Diagnosis not present

## 2020-04-07 HISTORY — DX: Anemia, unspecified: D64.9

## 2020-04-07 HISTORY — DX: Other specified postprocedural states: Z98.890

## 2020-04-07 HISTORY — DX: Other specified postprocedural states: R11.2

## 2020-04-07 HISTORY — DX: Unspecified atrial fibrillation: I48.91

## 2020-04-07 LAB — APTT: aPTT: 28 seconds (ref 24–36)

## 2020-04-07 LAB — SURGICAL PCR SCREEN
MRSA, PCR: NEGATIVE
Staphylococcus aureus: NEGATIVE

## 2020-04-07 LAB — COMPREHENSIVE METABOLIC PANEL
ALT: 17 U/L (ref 0–44)
AST: 19 U/L (ref 15–41)
Albumin: 4.2 g/dL (ref 3.5–5.0)
Alkaline Phosphatase: 62 U/L (ref 38–126)
Anion gap: 8 (ref 5–15)
BUN: 11 mg/dL (ref 6–20)
CO2: 29 mmol/L (ref 22–32)
Calcium: 9.2 mg/dL (ref 8.9–10.3)
Chloride: 104 mmol/L (ref 98–111)
Creatinine, Ser: 0.87 mg/dL (ref 0.61–1.24)
GFR calc Af Amer: >60 mL/min (ref >60)
GFR calc non Af Amer: >60 mL/min (ref >60)
Glucose, Bld: 109 mg/dL — ABNORMAL HIGH (ref 70–99)
Potassium: 4.5 mmol/L (ref 3.5–5.1)
Sodium: 141 mmol/L (ref 135–145)
Total Bilirubin: 0.8 mg/dL (ref 0.3–1.2)
Total Protein: 7.1 g/dL (ref 6.5–8.1)

## 2020-04-07 LAB — CBC
HCT: 35.7 % — ABNORMAL LOW (ref 39.0–52.0)
Hemoglobin: 11.5 g/dL — ABNORMAL LOW (ref 13.0–17.0)
MCH: 27.6 pg (ref 26.0–34.0)
MCHC: 32.2 g/dL (ref 30.0–36.0)
MCV: 85.6 fL (ref 80.0–100.0)
Platelets: 364 10*3/uL (ref 150–400)
RBC: 4.17 MIL/uL — ABNORMAL LOW (ref 4.22–5.81)
RDW: 14.4 % (ref 11.5–15.5)
WBC: 8.5 10*3/uL (ref 4.0–10.5)
nRBC: 0 % (ref 0.0–0.2)

## 2020-04-07 LAB — PROTIME-INR
INR: 1 (ref 0.8–1.2)
Prothrombin Time: 12.8 seconds (ref 11.4–15.2)

## 2020-04-08 ENCOUNTER — Other Ambulatory Visit (HOSPITAL_COMMUNITY)
Admission: RE | Admit: 2020-04-08 | Discharge: 2020-04-08 | Disposition: A | Discharge status: Home / Self Care | Payer: Managed Care, Other (non HMO) | Source: Ambulatory Visit | Attending: Orthopedic Surgery | Admitting: Orthopedic Surgery

## 2020-04-08 DIAGNOSIS — Z20822 Contact with and (suspected) exposure to covid-19: Secondary | ICD-10-CM | POA: Insufficient documentation

## 2020-04-08 DIAGNOSIS — Z01812 Encounter for preprocedural laboratory examination: Secondary | ICD-10-CM | POA: Insufficient documentation

## 2020-04-08 LAB — SARS CORONAVIRUS 2 (TAT 6-24 HRS): SARS Coronavirus 2: NEGATIVE

## 2020-04-08 NOTE — H&P (Signed)
ADMISSION H&P  Patient is admitted for left hip bearing versus total hip arthroplasty revision  Subjective:  Chief Complaint: Recurrent dislocation, left total hip  HPI: Sean Miles, 58 y.o. male, is status post left total hip arthroplasty (anterior approach), which was performed by Ollen Gross, MD in 2021. Mr. Wadding has had two dislocations of the left hip following surgery, which is extremely unusual following an anterior hip. The second dislocation occurred while a knee immobilizer was being worn. Both dislocations were successfully reduced in the emergency room upon presentation. He was seen in our office on 03/30/2020 following the second dislocation, where it was decided that the best approach to correcting this issue is through a bearing surface revision.  Patient Active Problem List   Diagnosis Date Noted  . Lumbar stenosis 07/10/2018  . Pre-operative cardiovascular examination 05/28/2018  . Psoriasis 05/28/2018  . Paroxysmal atrial fibrillation (HCC) 07/10/2017  . S/P shoulder replacement 07/31/2014  . OA (osteoarthritis) of knee 06/08/2014  . Sinus tachycardia 07/20/2011  . Hypothyroidism 07/20/2011  . Hyperlipidemia 07/20/2011  . HTN (hypertension) 07/20/2011  . Sleep apnea 07/20/2011    Past Medical History:  Diagnosis Date  . A-fib (HCC)   . Anemia    mild prior to first hip surgery   . Anxiety    takes xanax 1- 2 times per day  . Arthritis    psoriatic  . Complication of anesthesia    post anesthesia low sat rates  . Hypercholesteremia   . Hypertension    followed by Dr. Hyacinth Meeker (primary)  . Hypothyroidism    takes armour thyroid  . PONV (postoperative nausea and vomiting)   . Psoriatic arthritis (HCC)   . Sleep apnea     no cpap machine mild per pt     Past Surgical History:  Procedure Laterality Date  . ANTERIOR LAT LUMBAR FUSION  07/19/2011   Procedure: ANTERIOR LATERAL LUMBAR FUSION 2 LEVELS;  Surgeon: Alvy Beal;  Location: [facility] OR;   Service: Orthopedics;  Laterality: N/A;  Xlif and Posterior Spinal Fusion interbody L2-L4  . EYE SURGERY     lasik  . HAND SURGERY     right hand removal of growth 1991  . JOINT REPLACEMENT     right total knee, 2006 , left knee  2015  . LUMBAR FUSION  07/19/2011   lateral lumbar fusion   . LUMBAR LAMINECTOMY/DECOMPRESSION MICRODISCECTOMY N/A 07/10/2018   Procedure: L4-5 decompression and excision of cyst;  Surgeon: Venita Lick, MD;  Location: [facility] OR;  Service: Orthopedics;  Laterality: N/A;  3 hrs  . REVERSE SHOULDER ARTHROPLASTY Left 07/31/2014   Procedure: LEFT REVERSE SHOULDER ARTHROPLASTY;  Surgeon: Verlee Rossetti, MD;  Location: [facility] OR;  Service: Orthopedics;  Laterality: Left;  . ROTATOR CUFF REPAIR     left, august 2011  . TONSILLECTOMY    . TOTAL KNEE ARTHROPLASTY Left 06/08/2014   Procedure: LEFT TOTAL KNEE ARTHROPLASTY;  Surgeon: Loanne Drilling, MD;  Location: [facility] ORS;  Service: Orthopedics;  Laterality: Left;    Prior to Admission medications   Medication Sig Start Date End Date Taking? Authorizing Provider  ALPRAZolam Prudy Feeler) 1 MG tablet Take 1 mg by mouth daily as needed for anxiety.  01/10/20  Yes [provider]  Apremilast (OTEZLA) 30 MG TABS Take 30 mg by mouth 2 (two) times daily.    Yes [provider]  atorvastatin (LIPITOR) 40 MG tablet Take 40 mg by mouth daily.   Yes [provider]  [employer] ASPIRIN 325 MG tablet Take 325 mg by mouth daily. 03/02/20  Yes [provider]  diclofenac (VOLTAREN) 75 MG EC tablet Take 75 mg by mouth 2 (two) times daily. 01/10/20  Yes [provider]  DILT-XR 180 MG 24 hr capsule TAKE 1 CAPSULE (180 MG TOTAL) DAILY BY MOUTH. Patient taking differently: Take 180 mg by mouth daily.  08/26/19  Yes Runell Gess, MD  fenofibrate 160 MG tablet Take 160 mg by mouth at bedtime.   Yes [provider]  flecainide (TAMBOCOR) 50 MG tablet TAKE 1 TABLET BY MOUTH TWICE A DAY Patient taking  differently: Take 50 mg by mouth 2 (two) times daily.  08/05/19  Yes Runell Gess, MD  folic acid (FOLVITE) 1 MG tablet Take 1 mg by mouth 2 (two) times daily. 01/13/20  Yes [provider]  levothyroxine (SYNTHROID) 175 MCG tablet Take 175 mcg by mouth daily before breakfast.  06/22/17  Yes [provider]  lisinopril-hydrochlorothiazide (ZESTORETIC) 20-12.5 MG tablet Take 1 tablet by mouth daily. 08/31/19  Yes [provider]  methocarbamol (ROBAXIN) 750 MG tablet Take 750 mg by mouth 3 (three) times daily. 01/16/20  Yes [provider]  methotrexate (RHEUMATREX) 2.5 MG tablet Take 20 mg by mouth once a week.  04/20/14  Yes [provider]  oxyCODONE-acetaminophen (PERCOCET/ROXICET) 5-325 MG tablet Take 1-2 tablets by mouth every 6 (six) hours as needed for severe pain. 03/23/20  Yes Pollyann Savoy, MD  ferrous sulfate 325 (65 FE) MG tablet ferrous sulfate 325 mg (65 mg iron) tablet  Take 1 tablet every day by oral route. Patient not taking: No sig reported    [provider]    No Known Allergies  Social History   Socioeconomic History  . Marital status: Married    Spouse name: Not on file  . Number of children: 2  . Years of education: 20  . Highest education level: Not on file  Occupational History  . Not on file  Tobacco Use  . Smoking status: Former Smoker    Packs/day: 0.25    Years: 10.00    Pack years: 2.50    Types: Cigarettes    Quit date: 07/19/1985    Years since quitting: 34.7  . Smokeless tobacco: Never Used  . Tobacco comment: approx. 20 years ago  Vaping Use  . Vaping Use: Never used  Substance and Sexual Activity  . Alcohol use: Yes    Alcohol/week: 2.0 standard drinks    Types: 2 Cans of beer per week    Comment: once per week   . Drug use: No  . Sexual activity: Yes  Other Topics Concern  . Not on file  Social History Narrative  . Not on file   Social Determinants of Health   Financial  Resource Strain:   . Difficulty of Paying Living Expenses: Not on file  Food Insecurity:   . Worried About Programme researcher, broadcasting/film/video in the Last Year: Not on file  . Ran Out of Food in the Last Year: Not on file  Transportation Needs:   . Lack of Transportation (Medical): Not on file  . Lack of Transportation (Non-Medical): Not on file  Physical Activity:   . Days of Exercise per Week: Not on file  . Minutes of Exercise per Session: Not on file  Stress:   . Feeling of Stress : Not on file  Social Connections:   . Frequency of Communication with Friends and  Family: Not on file  . Frequency of Social Gatherings with Friends and Family: Not on file  . Attends Religious Services: Not on file  . Active Member of Clubs or Organizations: Not on file  . Attends Banker Meetings: Not on file  . Marital Status: Not on file  Intimate Partner Violence:   . Fear of Current or Ex-Partner: Not on file  . Emotionally Abused: Not on file  . Physically Abused: Not on file  . Sexually Abused: Not on file      Tobacco Use: Medium Risk  . Smoking Tobacco Use: Former Smoker  . Smokeless Tobacco Use: Never Used   Social History   Substance and Sexual Activity  Alcohol Use Yes  . Alcohol/week: 2.0 standard drinks  . Types: 2 Cans of beer per week   Comment: once per week     Family History  Problem Relation Age of Onset  . Hypertension Father   . Diabetes Father   . Stroke Father   . COPD Mother   . Heart failure Mother   . Coronary artery disease Brother     Review of Systems  Constitutional: Negative for chills and fever.  HENT: Negative for congestion, sore throat and tinnitus.   Eyes: Negative for double vision, photophobia and pain.  Respiratory: Negative for cough, shortness of breath and wheezing.   Cardiovascular: Negative for chest pain, palpitations and orthopnea.  Gastrointestinal: Negative for heartburn, nausea and vomiting.  Genitourinary: Negative for dysuria,  frequency and urgency.  Musculoskeletal: Positive for joint pain.  Neurological: Negative for dizziness, weakness and headaches.     Objective:  Physical Exam: Left hip exam:  There is no swelling.  The patient is in a knee immobilizer today.  The range of motion: Flexion to 100 degrees, Internal rotation to 20 degrees, external rotation to 20 degrees, and abduction to 30 degrees without discomfort.   Imaging Review X-rays from [facility] and he had an anterior dislocation with concentric reduction in the hospital.    X-[facility] from [facility] showed the same. His components appear to be in excellent position. He may have a tiny bit less offset with his femoral stem on this side compared to his native femur. It appears as though his leg lengths are equal on the AP pelvis.   Assessment/Plan:  Recurrent dislocation, left total hip   Unfortunately, he has had 2 dislocation episodes. This is extraordinarily unusual for an anterior hip. His cup and stem position looked great. I think it is either an offset issue or a length issue. Hopefully, with a larger length head, we will tell him to take care of this. If not, then we may need to take the stem out increase the offset on the stem or potentially do a constrained liner. We went over all these possibilities in detail, risks and benefits were discussed with the patient.  The patient acknowledged the explanation, agreed to proceed with the plan and consent was signed. Patient is being admitted for inpatient treatment for surgery, pain control, PT, OT, prophylactic antibiotics, VTE prophylaxis, progressive ambulation and ADLs and discharge planning.The patient is planning to be discharged home.  Arther Abbott, PA-C Orthopedic Surgery [facility]

## 2020-04-08 NOTE — Progress Notes (Signed)
Anesthesia Chart Review   Case: [id number] Date/Time: 04/12/20 1225   Procedure: Left hip bearing versus total hip arthroplasty revision-anterior (Left Hip) -   Anesthesia type: Choice   Pre-op diagnosis: Recurrent dislocation left total hip arthroplasty   [facility] ROOM 10 / [facility]   Surgeons: Ollen Gross, MD      DISCUSSION:59 y.o. former smoker (2.5 pack years, quit 07/19/85) with h/o PONV, HTN, sleep apnea, A-fib s/p cardioversion 03/2016, hypothyroidism, recurrent dislocation left total hip arthroplasty scheduled for above procedure 04/12/2020 with Dr. Ollen Gross.   Last seen by cardiology 01/23/2020, stable at this visit.    Clearance from PCP on chart which states pt is low risk for upcoming procedure.    Anticipate pt can proceed with planned procedure barring acute status change.   VS: BP (!) 161/80   Pulse 70   Temp 37 C (Oral)   Resp 16   Ht 5\' 10"  (1.778 m)   Wt 114.8 kg   SpO2 98%   BMI 36.30 kg/m   PROVIDERS: , MD is PCP   Sigmund Hazel, MD is Cardiologist  LABS: Labs reviewed: Acceptable for surgery. (all labs ordered are listed, but only abnormal results are displayed)  Labs Reviewed  CBC - Abnormal; Notable for the following components:      Result Value   RBC 4.17 (*)    Hemoglobin 11.5 (*)    HCT 35.7 (*)    All other components within normal limits  COMPREHENSIVE METABOLIC PANEL - Abnormal; Notable for the following components:   Glucose, Bld 109 (*)    All other components within normal limits  SURGICAL PCR SCREEN  APTT  PROTIME-INR  TYPE AND SCREEN     IMAGES:   EKG: 01/23/2020 Rate 71 bpm  NSR  CV: Echo 07/21/2011 Study Conclusions   Left ventricle: The cavity size was normal. Wall thickness  was increased in a pattern of mild LVH. The estimated  ejection fraction was 65%. Wall motion was normal; there  were no regional wall motion abnormalities.  Past Medical History:  Diagnosis Date  . A-fib (HCC)    . Anemia    mild prior to first hip surgery   . Anxiety    takes xanax 1- 2 times per day  . Arthritis    psoriatic  . Complication of anesthesia    post anesthesia low sat rates  . Hypercholesteremia   . Hypertension    followed by Dr. 07/23/2011 (primary)  . Hypothyroidism    takes armour thyroid  . PONV (postoperative nausea and vomiting)   . Psoriatic arthritis (HCC)   . Sleep apnea     no cpap machine mild per pt     Past Surgical History:  Procedure Laterality Date  . ANTERIOR LAT LUMBAR FUSION  07/19/2011   Procedure: ANTERIOR LATERAL LUMBAR FUSION 2 LEVELS;  Surgeon: 07/21/2011;  Location: [facility] OR;  Service: Orthopedics;  Laterality: N/A;  Xlif and Posterior Spinal Fusion interbody L2-L4  . EYE SURGERY     lasik  . HAND SURGERY     right hand removal of growth 1991  . JOINT REPLACEMENT     right total knee, 2006 , left knee  2015  . LUMBAR FUSION  07/19/2011   lateral lumbar fusion   . LUMBAR LAMINECTOMY/DECOMPRESSION MICRODISCECTOMY N/A 07/10/2018   Procedure: L4-5 decompression and excision of cyst;  Surgeon: 07/12/2018, MD;  Location: [facility] OR;  Service: Orthopedics;  Laterality: N/A;  3  hrs  . REVERSE SHOULDER ARTHROPLASTY Left 07/31/2014   Procedure: LEFT REVERSE SHOULDER ARTHROPLASTY;  Surgeon: Verlee Rossetti, MD;  Location: [facility] OR;  Service: Orthopedics;  Laterality: Left;  . ROTATOR CUFF REPAIR     left, august 2011  . TONSILLECTOMY    . TOTAL KNEE ARTHROPLASTY Left 06/08/2014   Procedure: LEFT TOTAL KNEE ARTHROPLASTY;  Surgeon: Loanne Drilling, MD;  Location: [facility];  Service: Orthopedics;  Laterality: Left;    MEDICATIONS: . ALPRAZolam (XANAX) 1 MG tablet  . Apremilast (OTEZLA) 30 MG TABS  . atorvastatin (LIPITOR) 40 MG tablet  . [employer] ASPIRIN 325 MG tablet  . diclofenac (VOLTAREN) 75 MG EC tablet  . DILT-XR 180 MG 24 hr capsule  . fenofibrate 160 MG tablet  . ferrous sulfate 325 (65 FE) MG tablet  . flecainide (TAMBOCOR) 50 MG tablet  . folic  acid (FOLVITE) 1 MG tablet  . levothyroxine (SYNTHROID) 175 MCG tablet  . lisinopril-hydrochlorothiazide (ZESTORETIC) 20-12.5 MG tablet  . methocarbamol (ROBAXIN) 750 MG tablet  . methotrexate (RHEUMATREX) 2.5 MG tablet  . oxyCODONE-acetaminophen (PERCOCET/ROXICET) 5-325 MG tablet   No current facility-administered medications for this encounter.    Jodell Cipro, PA-C [facility] [phone]

## 2020-04-12 ENCOUNTER — Ambulatory Visit (HOSPITAL_COMMUNITY): Payer: Managed Care, Other (non HMO) | Admitting: Certified Registered Nurse Anesthetist

## 2020-04-12 ENCOUNTER — Ambulatory Visit (HOSPITAL_COMMUNITY): Payer: Managed Care, Other (non HMO)

## 2020-04-12 ENCOUNTER — Encounter (HOSPITAL_COMMUNITY)
Admission: RE | Disposition: A | Discharge status: Home / Self Care | Payer: Self-pay | Source: Home / Self Care | Attending: Orthopedic Surgery

## 2020-04-12 ENCOUNTER — Encounter (HOSPITAL_COMMUNITY): Payer: Self-pay | Admitting: Orthopedic Surgery

## 2020-04-12 ENCOUNTER — Other Ambulatory Visit: Payer: Self-pay

## 2020-04-12 ENCOUNTER — Ambulatory Visit (HOSPITAL_COMMUNITY)
Admission: RE | Admit: 2020-04-12 | Discharge: 2020-04-13 | Disposition: A | Discharge status: Home / Self Care | Payer: Managed Care, Other (non HMO) | Attending: Orthopedic Surgery | Admitting: Orthopedic Surgery

## 2020-04-12 ENCOUNTER — Ambulatory Visit (HOSPITAL_COMMUNITY): Payer: Managed Care, Other (non HMO) | Admitting: Physician Assistant

## 2020-04-12 DIAGNOSIS — G4730 Sleep apnea, unspecified: Secondary | ICD-10-CM | POA: Insufficient documentation

## 2020-04-12 DIAGNOSIS — T84021A Dislocation of internal left hip prosthesis, initial encounter: Secondary | ICD-10-CM | POA: Insufficient documentation

## 2020-04-12 DIAGNOSIS — Z419 Encounter for procedure for purposes other than remedying health state, unspecified: Secondary | ICD-10-CM

## 2020-04-12 DIAGNOSIS — E785 Hyperlipidemia, unspecified: Secondary | ICD-10-CM | POA: Diagnosis not present

## 2020-04-12 DIAGNOSIS — I480 Paroxysmal atrial fibrillation: Secondary | ICD-10-CM | POA: Insufficient documentation

## 2020-04-12 DIAGNOSIS — I10 Essential (primary) hypertension: Secondary | ICD-10-CM | POA: Insufficient documentation

## 2020-04-12 DIAGNOSIS — Z981 Arthrodesis status: Secondary | ICD-10-CM | POA: Insufficient documentation

## 2020-04-12 DIAGNOSIS — E7800 Pure hypercholesterolemia, unspecified: Secondary | ICD-10-CM | POA: Diagnosis not present

## 2020-04-12 DIAGNOSIS — Z96653 Presence of artificial knee joint, bilateral: Secondary | ICD-10-CM | POA: Insufficient documentation

## 2020-04-12 DIAGNOSIS — Z79890 Hormone replacement therapy (postmenopausal): Secondary | ICD-10-CM | POA: Diagnosis not present

## 2020-04-12 DIAGNOSIS — M1990 Unspecified osteoarthritis, unspecified site: Secondary | ICD-10-CM | POA: Diagnosis not present

## 2020-04-12 DIAGNOSIS — E039 Hypothyroidism, unspecified: Secondary | ICD-10-CM | POA: Diagnosis not present

## 2020-04-12 DIAGNOSIS — Z791 Long term (current) use of non-steroidal anti-inflammatories (NSAID): Secondary | ICD-10-CM | POA: Diagnosis not present

## 2020-04-12 DIAGNOSIS — Z79899 Other long term (current) drug therapy: Secondary | ICD-10-CM | POA: Insufficient documentation

## 2020-04-12 DIAGNOSIS — T84028A Dislocation of other internal joint prosthesis, initial encounter: Secondary | ICD-10-CM

## 2020-04-12 DIAGNOSIS — F419 Anxiety disorder, unspecified: Secondary | ICD-10-CM | POA: Diagnosis not present

## 2020-04-12 DIAGNOSIS — Y792 Prosthetic and other implants, materials and accessory orthopedic devices associated with adverse incidents: Secondary | ICD-10-CM | POA: Insufficient documentation

## 2020-04-12 DIAGNOSIS — Z87891 Personal history of nicotine dependence: Secondary | ICD-10-CM | POA: Diagnosis not present

## 2020-04-12 DIAGNOSIS — M24452 Recurrent dislocation, left hip: Secondary | ICD-10-CM | POA: Diagnosis present

## 2020-04-12 DIAGNOSIS — Z96649 Presence of unspecified artificial hip joint: Secondary | ICD-10-CM

## 2020-04-12 HISTORY — PX: TOTAL HIP ARTHROPLASTY: SHX124

## 2020-04-12 LAB — TYPE AND SCREEN
ABO/RH(D): O POS
Antibody Screen: NEGATIVE

## 2020-04-12 SURGERY — ARTHROPLASTY, HIP, TOTAL, ANTERIOR APPROACH
Anesthesia: Spinal | Site: Hip | Laterality: Left

## 2020-04-12 MED ORDER — KETOROLAC TROMETHAMINE 30 MG/ML IJ SOLN: 30.0000 mg | Freq: Once | INTRAMUSCULAR | Status: DC | PRN

## 2020-04-12 MED ORDER — MIDAZOLAM HCL 5 MG/5ML IJ SOLN
INTRAMUSCULAR | Status: DC | PRN
  Administered 2020-04-12: 2 mg via INTRAVENOUS

## 2020-04-12 MED ORDER — HYDROMORPHONE HCL 1 MG/ML IJ SOLN
INTRAMUSCULAR | Status: AC
Start: 2020-04-12 — End: 2020-04-13
  Filled 2020-04-12: qty 1

## 2020-04-12 MED ORDER — PROMETHAZINE HCL 25 MG/ML IJ SOLN: 6.2500 mg | INTRAMUSCULAR | Status: DC | PRN

## 2020-04-12 MED ORDER — TRANEXAMIC ACID-NACL 1000-0.7 MG/100ML-% IV SOLN
1000.0000 mg | INTRAVENOUS | Status: AC
  Administered 2020-04-12: 1000 mg via INTRAVENOUS
  Filled 2020-04-12: qty 100

## 2020-04-12 MED ORDER — PROPOFOL 10 MG/ML IV BOLUS
INTRAVENOUS | Status: DC | PRN
  Administered 2020-04-12 (×3): 20 mg via INTRAVENOUS

## 2020-04-12 MED ORDER — PHENYLEPHRINE HCL-NACL 10-0.9 MG/250ML-% IV SOLN
INTRAVENOUS | Status: DC | PRN
  Administered 2020-04-12: 30 ug/min via INTRAVENOUS

## 2020-04-12 MED ORDER — 0.9 % SODIUM CHLORIDE (POUR BTL) OPTIME
TOPICAL | Status: DC | PRN
  Administered 2020-04-12: 1000 mL

## 2020-04-12 MED ORDER — POLYETHYLENE GLYCOL 3350 17 G PO PACK: 17.0000 g | PACK | Freq: Every day | ORAL | Status: DC | PRN

## 2020-04-12 MED ORDER — PROPOFOL 500 MG/50ML IV EMUL
INTRAVENOUS | Status: DC | PRN
  Administered 2020-04-12: 100 ug/kg/min via INTRAVENOUS

## 2020-04-12 MED ORDER — MIDAZOLAM HCL 2 MG/2ML IJ SOLN
INTRAMUSCULAR | Status: AC
  Filled 2020-04-12: qty 2

## 2020-04-12 MED ORDER — DEXAMETHASONE SODIUM PHOSPHATE 10 MG/ML IJ SOLN
INTRAMUSCULAR | Status: AC
  Filled 2020-04-12: qty 1

## 2020-04-12 MED ORDER — LIDOCAINE HCL (CARDIAC) PF 100 MG/5ML IV SOSY
PREFILLED_SYRINGE | INTRAVENOUS | Status: DC | PRN
  Administered 2020-04-12: 60 mg via INTRAVENOUS

## 2020-04-12 MED ORDER — EPHEDRINE SULFATE-NACL 50-0.9 MG/10ML-% IV SOSY
PREFILLED_SYRINGE | INTRAVENOUS | Status: DC | PRN
  Administered 2020-04-12: 10 mg via INTRAVENOUS

## 2020-04-12 MED ORDER — ATORVASTATIN CALCIUM 40 MG PO TABS
40.0000 mg | ORAL_TABLET | Freq: Every day | ORAL | Status: DC
  Administered 2020-04-13: 40 mg via ORAL
  Filled 2020-04-12: qty 1

## 2020-04-12 MED ORDER — METHOCARBAMOL 500 MG IVPB - SIMPLE MED
INTRAVENOUS | Status: AC
  Filled 2020-04-12: qty 50

## 2020-04-12 MED ORDER — LIDOCAINE 2% (20 MG/ML) 5 ML SYRINGE
INTRAMUSCULAR | Status: AC
  Filled 2020-04-12: qty 5

## 2020-04-12 MED ORDER — METOCLOPRAMIDE HCL 5 MG PO TABS: 5.0000 mg | ORAL_TABLET | Freq: Three times a day (TID) | ORAL | Status: DC | PRN

## 2020-04-12 MED ORDER — FENOFIBRATE 160 MG PO TABS
160.0000 mg | ORAL_TABLET | Freq: Every day | ORAL | Status: DC
  Administered 2020-04-12: 160 mg via ORAL
  Filled 2020-04-12: qty 1

## 2020-04-12 MED ORDER — METHOCARBAMOL 500 MG IVPB - SIMPLE MED
500.0000 mg | Freq: Four times a day (QID) | INTRAVENOUS | Status: DC | PRN
  Administered 2020-04-12: 500 mg via INTRAVENOUS
  Filled 2020-04-12: qty 50

## 2020-04-12 MED ORDER — PHENOL 1.4 % MT LIQD: 1.0000 | OROMUCOSAL | Status: DC | PRN

## 2020-04-12 MED ORDER — MORPHINE SULFATE (PF) 2 MG/ML IV SOLN
0.5000 mg | INTRAVENOUS | Status: DC | PRN
  Administered 2020-04-12 – 2020-04-13 (×4): 1 mg via INTRAVENOUS
  Filled 2020-04-12 (×4): qty 1

## 2020-04-12 MED ORDER — CEFAZOLIN SODIUM-DEXTROSE 2-4 GM/100ML-% IV SOLN
2.0000 g | INTRAVENOUS | Status: AC
  Administered 2020-04-12: 2 g via INTRAVENOUS
  Filled 2020-04-12: qty 100

## 2020-04-12 MED ORDER — ALPRAZOLAM 1 MG PO TABS
1.0000 mg | ORAL_TABLET | Freq: Every day | ORAL | Status: DC | PRN
  Administered 2020-04-12: 1 mg via ORAL
  Filled 2020-04-12: qty 1

## 2020-04-12 MED ORDER — OXYCODONE HCL 5 MG PO TABS
5.0000 mg | ORAL_TABLET | ORAL | Status: DC | PRN
  Administered 2020-04-12: 10 mg via ORAL
  Filled 2020-04-12 (×2): qty 2

## 2020-04-12 MED ORDER — HYDROCHLOROTHIAZIDE 12.5 MG PO CAPS
12.5000 mg | ORAL_CAPSULE | Freq: Every day | ORAL | Status: DC
  Administered 2020-04-13: 12.5 mg via ORAL
  Filled 2020-04-12: qty 1

## 2020-04-12 MED ORDER — ONDANSETRON HCL 4 MG/2ML IJ SOLN: 4.0000 mg | Freq: Four times a day (QID) | INTRAMUSCULAR | Status: DC | PRN

## 2020-04-12 MED ORDER — MAGNESIUM CITRATE PO SOLN: 1.0000 | Freq: Once | ORAL | Status: DC | PRN

## 2020-04-12 MED ORDER — BUPIVACAINE IN DEXTROSE 0.75-8.25 % IT SOLN
INTRATHECAL | Status: DC | PRN
  Administered 2020-04-12: 1.6 mL via INTRATHECAL

## 2020-04-12 MED ORDER — ASPIRIN EC 325 MG PO TBEC
325.0000 mg | DELAYED_RELEASE_TABLET | Freq: Two times a day (BID) | ORAL | Status: DC
  Administered 2020-04-13: 325 mg via ORAL
  Filled 2020-04-12: qty 1

## 2020-04-12 MED ORDER — DEXAMETHASONE SODIUM PHOSPHATE 10 MG/ML IJ SOLN
10.0000 mg | Freq: Once | INTRAMUSCULAR | Status: AC
  Administered 2020-04-13: 10 mg via INTRAVENOUS
  Filled 2020-04-12: qty 1

## 2020-04-12 MED ORDER — BISACODYL 10 MG RE SUPP: 10.0000 mg | Freq: Every day | RECTAL | Status: DC | PRN

## 2020-04-12 MED ORDER — BUPIVACAINE HCL 0.25 % IJ SOLN
INTRAMUSCULAR | Status: DC | PRN
  Administered 2020-04-12: 30 mL

## 2020-04-12 MED ORDER — POVIDONE-IODINE 10 % EX SWAB
2.0000 "application " | Freq: Once | CUTANEOUS | Status: AC
  Administered 2020-04-12: 2 via TOPICAL

## 2020-04-12 MED ORDER — FLECAINIDE ACETATE 50 MG PO TABS
50.0000 mg | ORAL_TABLET | Freq: Two times a day (BID) | ORAL | Status: DC
  Administered 2020-04-12 – 2020-04-13 (×2): 50 mg via ORAL
  Filled 2020-04-12 (×2): qty 1

## 2020-04-12 MED ORDER — ORAL CARE MOUTH RINSE: 15.0000 mL | Freq: Once | OROMUCOSAL | Status: AC

## 2020-04-12 MED ORDER — DOCUSATE SODIUM 100 MG PO CAPS
100.0000 mg | ORAL_CAPSULE | Freq: Two times a day (BID) | ORAL | Status: DC
  Administered 2020-04-12 – 2020-04-13 (×2): 100 mg via ORAL
  Filled 2020-04-12 (×2): qty 1

## 2020-04-12 MED ORDER — ACETAMINOPHEN 10 MG/ML IV SOLN
1000.0000 mg | Freq: Four times a day (QID) | INTRAVENOUS | Status: DC
  Administered 2020-04-12: 1000 mg via INTRAVENOUS
  Filled 2020-04-12: qty 100

## 2020-04-12 MED ORDER — DEXAMETHASONE SODIUM PHOSPHATE 10 MG/ML IJ SOLN
INTRAMUSCULAR | Status: DC | PRN
  Administered 2020-04-12: 10 mg via INTRAVENOUS

## 2020-04-12 MED ORDER — PROPOFOL 1000 MG/100ML IV EMUL
INTRAVENOUS | Status: AC
  Filled 2020-04-12: qty 100

## 2020-04-12 MED ORDER — DEXAMETHASONE SODIUM PHOSPHATE 10 MG/ML IJ SOLN: 8.0000 mg | Freq: Once | INTRAMUSCULAR | Status: DC

## 2020-04-12 MED ORDER — CEFAZOLIN SODIUM-DEXTROSE 2-4 GM/100ML-% IV SOLN
2.0000 g | Freq: Four times a day (QID) | INTRAVENOUS | Status: AC
  Administered 2020-04-12 – 2020-04-13 (×2): 2 g via INTRAVENOUS
  Filled 2020-04-12 (×2): qty 100

## 2020-04-12 MED ORDER — SODIUM CHLORIDE 0.9 % IV SOLN: INTRAVENOUS | Status: DC

## 2020-04-12 MED ORDER — ONDANSETRON HCL 4 MG/2ML IJ SOLN
INTRAMUSCULAR | Status: AC
  Filled 2020-04-12: qty 2

## 2020-04-12 MED ORDER — FENTANYL CITRATE (PF) 100 MCG/2ML IJ SOLN
INTRAMUSCULAR | Status: AC
  Filled 2020-04-12: qty 2

## 2020-04-12 MED ORDER — METHOCARBAMOL 500 MG PO TABS
500.0000 mg | ORAL_TABLET | Freq: Four times a day (QID) | ORAL | Status: DC | PRN
  Administered 2020-04-12 – 2020-04-13 (×2): 500 mg via ORAL
  Filled 2020-04-12 (×2): qty 1

## 2020-04-12 MED ORDER — TRAMADOL HCL 50 MG PO TABS
50.0000 mg | ORAL_TABLET | Freq: Four times a day (QID) | ORAL | Status: DC | PRN
  Administered 2020-04-12 – 2020-04-13 (×3): 100 mg via ORAL
  Filled 2020-04-12 (×4): qty 2

## 2020-04-12 MED ORDER — LEVOTHYROXINE SODIUM 50 MCG PO TABS
175.0000 ug | ORAL_TABLET | Freq: Every day | ORAL | Status: DC
  Administered 2020-04-13: 175 ug via ORAL
  Filled 2020-04-12: qty 1

## 2020-04-12 MED ORDER — LACTATED RINGERS IV SOLN: INTRAVENOUS | Status: DC

## 2020-04-12 MED ORDER — MENTHOL 3 MG MT LOZG: 1.0000 | LOZENGE | OROMUCOSAL | Status: DC | PRN

## 2020-04-12 MED ORDER — FENTANYL CITRATE (PF) 100 MCG/2ML IJ SOLN
INTRAMUSCULAR | Status: DC | PRN
Start: 2020-04-12 — End: 2020-04-12
  Administered 2020-04-12: 100 ug via INTRAVENOUS

## 2020-04-12 MED ORDER — LACTATED RINGERS IV SOLN: INTRAVENOUS | Status: DC | PRN

## 2020-04-12 MED ORDER — MEPERIDINE HCL 50 MG/ML IJ SOLN: 6.2500 mg | INTRAMUSCULAR | Status: DC | PRN

## 2020-04-12 MED ORDER — HYDROMORPHONE HCL 1 MG/ML IJ SOLN
0.2500 mg | INTRAMUSCULAR | Status: DC | PRN
  Administered 2020-04-12: 0.5 mg via INTRAVENOUS

## 2020-04-12 MED ORDER — PROPOFOL 10 MG/ML IV BOLUS
INTRAVENOUS | Status: AC
  Filled 2020-04-12: qty 20

## 2020-04-12 MED ORDER — BUPIVACAINE HCL 0.25 % IJ SOLN
INTRAMUSCULAR | Status: AC
  Filled 2020-04-12: qty 1

## 2020-04-12 MED ORDER — METOCLOPRAMIDE HCL 5 MG/ML IJ SOLN: 5.0000 mg | Freq: Three times a day (TID) | INTRAMUSCULAR | Status: DC | PRN

## 2020-04-12 MED ORDER — EPHEDRINE 5 MG/ML INJ
INTRAVENOUS | Status: AC
  Filled 2020-04-12: qty 10

## 2020-04-12 MED ORDER — WATER FOR IRRIGATION, STERILE IR SOLN
Status: DC | PRN
  Administered 2020-04-12: 2000 mL

## 2020-04-12 MED ORDER — CHLORHEXIDINE GLUCONATE 0.12 % MT SOLN
15.0000 mL | Freq: Once | OROMUCOSAL | Status: AC
  Administered 2020-04-12: 15 mL via OROMUCOSAL

## 2020-04-12 MED ORDER — ONDANSETRON HCL 4 MG/2ML IJ SOLN
INTRAMUSCULAR | Status: DC | PRN
  Administered 2020-04-12: 4 mg via INTRAVENOUS

## 2020-04-12 MED ORDER — ONDANSETRON HCL 4 MG PO TABS: 4.0000 mg | ORAL_TABLET | Freq: Four times a day (QID) | ORAL | Status: DC | PRN

## 2020-04-12 MED ORDER — DILTIAZEM HCL ER COATED BEADS 180 MG PO CP24
180.0000 mg | ORAL_CAPSULE | Freq: Every day | ORAL | Status: DC
  Administered 2020-04-13: 180 mg via ORAL
  Filled 2020-04-12: qty 1

## 2020-04-12 SURGICAL SUPPLY — 41 items
BAG DECANTER FOR FLEXI CONT (MISCELLANEOUS) IMPLANT
BAG ZIPLOCK 12X15 (MISCELLANEOUS) IMPLANT
BALL HIP CERAMIC 32MM PLUS 9 ×1 IMPLANT
BLADE SAG 18X100X1.27 (BLADE) ×2 IMPLANT
COVER PERINEAL POST (MISCELLANEOUS) ×2 IMPLANT
COVER SURGICAL LIGHT HANDLE (MISCELLANEOUS) ×2 IMPLANT
COVER WAND RF STERILE (DRAPES) IMPLANT
DECANTER SPIKE VIAL GLASS SM (MISCELLANEOUS) ×2 IMPLANT
DRAPE STERI IOBAN 125X83 (DRAPES) ×2 IMPLANT
DRAPE U-SHAPE 47X51 STRL (DRAPES) ×4 IMPLANT
DRSG ADAPTIC 3X8 NADH LF (GAUZE/BANDAGES/DRESSINGS) ×2 IMPLANT
DRSG AQUACEL AG ADV 3.5X10 (GAUZE/BANDAGES/DRESSINGS) ×2 IMPLANT
DURAPREP 26ML APPLICATOR (WOUND CARE) ×2 IMPLANT
ELECT REM PT RETURN 15FT ADLT (MISCELLANEOUS) ×2 IMPLANT
EVACUATOR 1/8 PVC DRAIN (DRAIN) IMPLANT
GLOVE BIO SURGEON STRL SZ 6 (GLOVE) IMPLANT
GLOVE BIO SURGEON STRL SZ7 (GLOVE) IMPLANT
GLOVE BIO SURGEON STRL SZ8 (GLOVE) ×2 IMPLANT
GLOVE BIOGEL PI IND STRL 6.5 (GLOVE) IMPLANT
GLOVE BIOGEL PI IND STRL 7.0 (GLOVE) IMPLANT
GLOVE BIOGEL PI IND STRL 8 (GLOVE) ×1 IMPLANT
GLOVE BIOGEL PI INDICATOR 6.5 (GLOVE)
GLOVE BIOGEL PI INDICATOR 7.0 (GLOVE)
GLOVE BIOGEL PI INDICATOR 8 (GLOVE) ×1
GOWN STRL REUS W/TWL LRG LVL3 (GOWN DISPOSABLE) ×2 IMPLANT
GOWN STRL REUS W/TWL XL LVL3 (GOWN DISPOSABLE) IMPLANT
HIP BALL CERAMIC 32MM PLUS 9 ×2 IMPLANT
HOLDER FOLEY CATH W/STRAP (MISCELLANEOUS) ×2 IMPLANT
KIT TURNOVER KIT A (KITS) IMPLANT
MANIFOLD NEPTUNE II (INSTRUMENTS) ×2 IMPLANT
PACK ANTERIOR HIP CUSTOM (KITS) ×2 IMPLANT
PENCIL SMOKE EVACUATOR COATED (MISCELLANEOUS) ×2 IMPLANT
STRIP CLOSURE SKIN 1/2X4 (GAUZE/BANDAGES/DRESSINGS) ×4 IMPLANT
SUT ETHIBOND NAB CT1 #1 30IN (SUTURE) ×2 IMPLANT
SUT MNCRL AB 4-0 PS2 18 (SUTURE) ×2 IMPLANT
SUT STRATAFIX 0 PDS 27 VIOLET (SUTURE) ×2
SUT VIC AB 2-0 CT1 27 (SUTURE) ×4
SUT VIC AB 2-0 CT1 TAPERPNT 27 (SUTURE) ×2 IMPLANT
SUTURE STRATFX 0 PDS 27 VIOLET (SUTURE) ×1 IMPLANT
SYR 50ML LL SCALE MARK (SYRINGE) IMPLANT
TRAY FOLEY MTR SLVR 16FR STAT (SET/KITS/TRAYS/PACK) ×2 IMPLANT

## 2020-04-12 NOTE — Discharge Instructions (Addendum)
Sean Gross, MD Total Joint Specialist [facility] [address] [place], [place] [address] [phone]  ANTERIOR APPROACH TOTAL HIP REVISION POSTOPERATIVE DIRECTIONS  Hip Rehabilitation, Guidelines Following Surgery  The results of a hip operation are greatly improved after range of motion and muscle strengthening exercises. Follow all safety measures which are given to protect your hip. If any of these exercises cause increased pain or swelling in your joint, decrease the amount until you are comfortable again. Then slowly increase the exercises. Call your caregiver if you have problems or questions.   BLOOD CLOT PREVENTION . Take a 325 mg Aspirin two times a day for three weeks following surgery. Then resume one 325 mg Aspirin once a day. Sean Miles may resume your vitamins/supplements upon discharge from the hospital. . Do not take any NSAIDs (Advil, Aleve, Ibuprofen, Meloxicam, etc.) until you have discontinued the 325 mg Aspirin.  HOME CARE INSTRUCTIONS  . Remove items at home which could result in a fall. This includes throw rugs or furniture in walking pathways.   ICE to the affected hip as frequently as 20-30 minutes an hour and then as needed for pain and swelling. Continue to use ice on the hip for pain and swelling from surgery. You may notice swelling that will progress down to the foot and ankle. This is normal after surgery. Elevate the leg when you are not up walking on it.    Continue to use the breathing machine which will help keep your temperature down.  It is common for your temperature to cycle up and down following surgery, especially at night when you are not up moving around and exerting yourself.  The breathing machine keeps your lungs expanded and your temperature down.  DIET You may resume your previous home diet once your are discharged from the hospital.  DRESSING / WOUND CARE / SHOWERING . You have an adhesive waterproof bandage  over the incision. Leave this in place until your first follow-up appointment. Once you remove this you will not need to place another bandage.  . You may begin showering 3 days following surgery, but do not submerge the incision under water.  ACTIVITY . For the first 3-5 days, it is important to rest and keep the operative leg elevated. You should, as a general rule, rest for 50 minutes and walk/stretch for 10 minutes per hour. After 5 days, you may slowly increase activity as tolerated.  [name] Perform the exercises you were provided twice a day for about 15-20 minutes each session. Begin these 2 days following surgery. . Walk with your walker as instructed. Use the walker until you are comfortable transitioning to a cane. Walk with the cane in the opposite hand of the operative leg. You may discontinue the cane once you are comfortable and walking steadily. . Avoid periods of inactivity such as sitting longer than an hour when not asleep. This helps prevent blood clots.  . Do not drive a car for 6 weeks or until released by your surgeon.  . Do not drive while taking narcotics.  TED HOSE STOCKINGS Wear the elastic stockings on both legs for three weeks following surgery during the day. You may remove them at night while sleeping.  WEIGHT BEARING Weight bearing as tolerated with assist device (walker, cane, etc) as directed, use it as long as suggested by your surgeon or therapist, typically at least 4-6 weeks.  POSTOPERATIVE CONSTIPATION PROTOCOL Constipation - defined medically as fewer than three stools per  week and severe constipation as less than one stool per week.  One of the most common issues patients have following surgery is constipation.  Even if you have a regular bowel pattern at home, your normal regimen is likely to be disrupted due to multiple reasons following surgery.  Combination of anesthesia, postoperative narcotics, change in appetite and fluid intake all can affect your  bowels.  In order to avoid complications following surgery, here are some recommendations in order to help you during your recovery period.  . Colace (docusate) - Pick up an over-the-counter form of Colace or another stool softener and take twice a day as long as you are requiring postoperative pain medications.  Take with a full glass of water daily.  If you experience loose stools or diarrhea, hold the colace until you stool forms back up.  If your symptoms do not get better within 1 week or if they get worse, check with your doctor. . Dulcolax (bisacodyl) - Pick up over-the-counter and take as directed by the product packaging as needed to assist with the movement of your bowels.  Take with a full glass of water.  Use this product as needed if not relieved by Colace only.  . MiraLax (polyethylene glycol) - Pick up over-the-counter to have on hand.  MiraLax is a solution that will increase the amount of water in your bowels to assist with bowel movements.  Take as directed and can mix with a glass of water, juice, soda, coffee, or tea.  Take if you go more than two days without a movement.Do not use MiraLax more than once per day. Call your doctor if you are still constipated or irregular after using this medication for 7 days in a row.  If you continue to have problems with postoperative constipation, please contact the office for further assistance and recommendations.  If you experience "the worst abdominal pain ever" or develop nausea or vomiting, please contact the office immediatly for further recommendations for treatment.  ITCHING  If you experience itching with your medications, try taking only a single pain pill, or even half a pain pill at a time.  You can also use Benadryl over the counter for itching or also to help with sleep.   MEDICATIONS See your medication summary on the "After Visit Summary" that the nursing staff will review with you prior to discharge.  You may have some home  medications which will be placed on hold until you complete the course of blood thinner medication.  It is important for you to complete the blood thinner medication as prescribed by your surgeon.  Continue your approved medications as instructed at time of discharge.  PRECAUTIONS If you experience chest pain or shortness of breath - call 911 immediately for transfer to the hospital emergency department.  If you develop a fever greater that 101 F, purulent drainage from wound, increased redness or drainage from wound, foul odor from the wound/dressing, or calf pain - CONTACT YOUR SURGEON.                                                   FOLLOW-UP APPOINTMENTS Make sure you keep all of your appointments after your operation with your surgeon and caregivers. You should call the office at the above phone number and make an appointment for approximately two weeks after  the date of your surgery or on the date instructed by your surgeon outlined in the "After Visit Summary".  RANGE OF MOTION AND STRENGTHENING EXERCISES  These exercises are designed to help you keep full movement of your hip joint. Follow your caregiver's or physical therapist's instructions. Perform all exercises about fifteen times, three times per day or as directed. Exercise both hips, even if you have had only one joint replacement. These exercises can be done on a training (exercise) mat, on the floor, on a table or on a bed. Use whatever works the best and is most comfortable for you. Use music or television while you are exercising so that the exercises are a pleasant break in your day. This will make your life better with the exercises acting as a break in routine you can look forward to.  . Lying on your back, slowly slide your foot toward your buttocks, raising your knee up off the floor. Then slowly slide your foot back down until your leg is straight again.  . Lying on your back spread your legs as far apart as you can without  causing discomfort.  . Lying on your side, raise your upper leg and foot straight up from the floor as far as is comfortable. Slowly lower the leg and repeat.  . Lying on your back, tighten up the muscle in the front of your thigh (quadriceps muscles). You can do this by keeping your leg straight and trying to raise your heel off the floor. This helps strengthen the largest muscle supporting your knee.  . Lying on your back, tighten up the muscles of your buttocks both with the legs straight and with the knee bent at a comfortable angle while keeping your heel on the floor.   IF YOU ARE TRANSFERRED TO A SKILLED REHAB FACILITY If the patient is transferred to a skilled rehab facility following release from the hospital, a list of the current medications will be sent to the facility for the patient to continue.  When discharged from the skilled rehab facility, please have the facility set up the patient's Home Health Physical Therapy prior to being released. Also, the skilled facility will be responsible for providing the patient with their medications at time of release from the facility to include their pain medication, the muscle relaxants, and their blood thinner medication. If the patient is still at the rehab facility at time of the two week follow up appointment, the skilled rehab facility will also need to assist the patient in arranging follow up appointment in our office and any transportation needs.  MAKE SURE YOU:  . Understand these instructions.  . Get help right away if you are not doing well or get worse.    DENTAL ANTIBIOTICS:  In most cases prophylactic antibiotics for Dental procdeures after total joint surgery are not necessary.  Exceptions are as follows:  1. History of prior total joint infection  2. Severely immunocompromised (Organ Transplant, cancer chemotherapy, Rheumatoid biologic meds such as Humera)  3. Poorly controlled diabetes (A1C &gt; 8.0, blood glucose over  200)  If you have one of these conditions, contact your surgeon for an antibiotic prescription, prior to your dental procedure.    Pick up stool softner and laxative for home use following surgery while on pain medications. Do not submerge incision under water. Please use good hand washing techniques while changing dressing each day. May shower starting three days after surgery. Please use a clean towel to pat the incision  dry following showers. Continue to use ice for pain and swelling after surgery. Do not use any lotions or creams on the incision until instructed by your surgeon.

## 2020-04-12 NOTE — Brief Op Note (Signed)
04/12/2020  2:06 PM  PATIENT:  Sean Miles  59 y.o. male  PRE-OPERATIVE DIAGNOSIS:  Recurrent dislocation left total hip arthroplasty  POST-OPERATIVE DIAGNOSIS:  Recurrent dislocation left total hip arthroplasty  PROCEDURE:  Procedure(s) with comments: FEMORAL HEAD REVISION (Left) -  SURGEON:  Surgeon(s) and Role:    Ollen Gross, MD - Primary  PHYSICIAN ASSISTANT:   ASSISTANTS: Arther Abbott, PA-C   ANESTHESIA:   spinal  EBL:  100 mL   BLOOD ADMINISTERED:none  DRAINS: none   LOCAL MEDICATIONS USED:  MARCAINE     COUNTS:  YES  TOURNIQUET:  * No tourniquets in log *  DICTATION: .Other Dictation: Dictation Number [id number]  PLAN OF CARE: Admit for overnight observation  PATIENT DISPOSITION:  PACU - hemodynamically stable.

## 2020-04-12 NOTE — Op Note (Signed)
NAME: Sean Miles, HAFF MEDICAL RECORD ZO:[id number] ACCOUNT [id number] DATE OF BIRTH:[birth year] FACILITY: [facility] LOCATION: [facility] PHYSICIAN:Zaydee Aina Dulcy Fanny, MD  OPERATIVE REPORT  DATE OF PROCEDURE:  04/12/2020  PREOPERATIVE DIAGNOSIS:  Recurrent dislocation, left total hip arthroplasty.  POSTOPERATIVE DIAGNOSIS:  Recurrent dislocation, left total hip arthroplasty.  PROCEDURE:  Left femoral head revision.  SURGEON:  Ollen Gross, MD  ASSISTANT:  Arther Abbott, PA-C  ANESTHESIA:  Spinal.  ESTIMATED BLOOD LOSS:  100 mL.  DRAINS:  None.  COMPLICATIONS:  None.  CONDITION:  Stable to recovery.  BRIEF CLINICAL NOTE:  The patient is a 59 year old male who had a left total hip arthroplasty done a little over a month ago.  He was doing fine initially and then sustained a dislocation episode with bending over.  He had a second dislocation episode  around a week to 2 weeks later.  It was decided that there was something inherently unstable in the hip and I felt that revision would be necessary.  His x-ray showed the components appeared to be in excellent position, but despite that, he was having  these episodes of dislocation.  He presents today for revision.  PROCEDURE IN DETAIL:  After successful administration anesthetic, the patient was placed on the Hana bed with his feet in well-padded traction boots and then was placed onto the Hana table.  He was placed in the appropriate position and x-rays were  taken, AP left hip, AP pelvis at the start of the procedure.  It appears as though that left lower extremity may have potentially been about 5-7 mm shorter than the right.  The thigh was isolated from the perineum and lower leg with plastic drapes and  then prepped and draped in the usual sterile fashion.  His previous incisions were utilized.  Skin cut with a 10 blade through subcutaneous tissue to the fascial layer, which was incised in line with the skin incision.  There was a fair  amount of  scarring, but I was still able to elevate the muscle off the fascia and then get down through the hip in the interval between the tensor and rectus.  The pseudocapsule was identified and excised to gain access to the hip joint.  The acetabular component  appeared to be in appropriate position.  The bone hook was placed around the neck of the femur and there was a fair amount of shuck and I was actually able to dislocate the hip just with utilizing a bone hook around the femoral neck.  We then removed the  femoral head, which was a 32+1.  I dissected more of the tissue around the cup to make sure there was no tissue impingement.  The femoral stem also appeared to be in excellent position and was well fixed.  I tried a 32+5 head and then reduced that.  The  hip was definitely more stable, but there was still a minimal amount of laxity with pulling with the bone hook.  On the x-ray, he was still a couple of millimeters short.  We thus removed the +5 head trial and placed a 32+9 head trial.  This had  reduction with a fantastic stability throughout the range of motion that the leg holder would allow.  We were able to extend about 50 degrees, rotate out 60, then in 20 and it was very stable there.  At neutral flexion and extension, there was great  stability.  Also when I flexed the leg up at about 20-30 degrees, there was great  stability.  I then used a bone hook to help dislocate the hip and we removed the trial and placed a 32+9 ceramic head.  The same stability parameters were present.  Wound  was copiously irrigated with saline solution.  Deep tissue was closed with interrupted #1 Ethibond.  The fascia was then closed with a running 0 Stratafix suture.  Thirty mL of 0.25% Marcaine with epinephrine injected in the subcutaneous tissues.   Subcutaneous was closed in 2 layers with 2-0 Vicryl and subcuticular layer was closed with a 4-0 Monocryl.  Incisions cleaned and dried and Steri-Strips applied.   Bulky sterile dressing was applied.  He was then awakened and transported to recovery in  stable condition.  Please note that a surgical assistant was a medical necessity for this procedure to do it in a safe and expeditious manner.  Surgical assistance necessary for retraction of the periarticular structures, safe removal of the old implant and safe and  accurate placement of the new implant.  VN/NUANCE  D:04/12/2020 T:04/12/2020 JOB:[id number]

## 2020-04-12 NOTE — Transfer of Care (Signed)
Immediate Anesthesia Transfer of Care Note  Patient: Sean Miles  Procedure(s) Performed: FEMORAL HEAD REVISION (Left Hip)  Patient Location: PACU  Anesthesia Type:Spinal  Level of Consciousness: awake, alert , oriented and patient cooperative  Airway & Oxygen Therapy: Patient Spontanous Breathing and Patient connected to face mask oxygen  Post-op Assessment: Report given to RN, Post -op Vital signs reviewed and stable and Patient moving all extremities  Post vital signs: Reviewed and stable  Last Vitals:  Vitals Value Taken Time  BP    Temp    Pulse    Resp    SpO2      Last Pain:  Vitals:   04/12/20 1114  TempSrc:   PainSc: 4       Patients Stated Pain Goal: 6 (04/12/20 1114)  Complications: No complications documented.

## 2020-04-12 NOTE — Anesthesia Procedure Notes (Signed)
Spinal  Patient location during procedure: OR End time: 04/12/2020 12:40 PM Staffing Performed: resident/CRNA  Resident/CRNA: Caryl Pina T, CRNA Preanesthetic Checklist Completed: patient identified, IV checked, site marked, risks and benefits discussed, surgical consent, monitors and equipment checked, pre-op evaluation and timeout performed Spinal Block Patient position: sitting Prep: DuraPrep Patient monitoring: heart rate, cardiac monitor, continuous pulse ox and blood pressure Approach: midline Location: L3-4 Injection technique: single-shot Needle Needle type: Pencan  Needle gauge: 24 G Needle length: 9 cm Assessment Sensory level: T4 Additional Notes IV functioning, monitors applied to pt. Expiration date of kit checked and confirmed to be in date. Sterile prep and drape, hand hygiene and sterile gloved used. Pt was positioned and spine was prepped in sterile fashion. Skin was anesthetized with lidocaine. Free flow of clear CSF obtained prior to injecting local anesthetic into CSF x 1 attempt. Spinal needle aspirated freely following injection. Needle was carefully withdrawn, and pt tolerated procedure well. Loss of motor and sensory on exam post injection.

## 2020-04-12 NOTE — Anesthesia Preprocedure Evaluation (Signed)
Anesthesia Evaluation  Patient identified by MRN, date of birth, ID band Patient awake    Reviewed: Allergy & Precautions, NPO status , Patient's Chart, lab work & pertinent test results  History of Anesthesia Complications (+) PONV and history of anesthetic complications  Airway Mallampati: II       Dental no notable dental hx.    Pulmonary sleep apnea , former smoker,    Pulmonary exam normal        Cardiovascular hypertension, Pt. on medications Normal cardiovascular exam     Neuro/Psych PSYCHIATRIC DISORDERS Anxiety    GI/Hepatic negative GI ROS, Neg liver ROS,   Endo/Other  Hypothyroidism   Renal/GU negative Renal ROS     Musculoskeletal  (+) Arthritis , Osteoarthritis,    Abdominal (+) + obese,   Peds  Hematology  (+) Blood dyscrasia, anemia ,   Anesthesia Other Findings   Reproductive/Obstetrics                             Anesthesia Physical Anesthesia Plan  ASA: II  Anesthesia Plan: Spinal   Post-op Pain Management:    Induction:   PONV Risk Score and Plan: 3 and Ondansetron, Dexamethasone and Midazolam  Airway Management Planned: Natural Airway and Simple Face Mask  Additional Equipment: None  Intra-op Plan:   Post-operative Plan:   Informed Consent: I have reviewed the patients History and Physical, chart, labs and discussed the procedure including the risks, benefits and alternatives for the proposed anesthesia with the patient or authorized representative who has indicated his/her understanding and acceptance.       Plan Discussed with: CRNA  Anesthesia Plan Comments:         Anesthesia Quick Evaluation

## 2020-04-12 NOTE — Interval H&P Note (Signed)
History and Physical Interval Note:  04/12/2020 11:34 AM  Sean Miles  has presented today for surgery, with the diagnosis of Recurrent dislocation left total hip arthroplasty.  The various methods of treatment have been discussed with the patient and family. After consideration of risks, benefits and other options for treatment, the patient has consented to  Procedure(s) with comments: TOTAL HIP ARTHROPLASTY ANTERIOR APPROACH (Left) - as a surgical intervention.  The patient's history has been reviewed, patient examined, no change in status, stable for surgery.  I have reviewed the patient's chart and labs.  Questions were answered to the patient's satisfaction.     Sean Miles

## 2020-04-12 NOTE — Progress Notes (Signed)
Orthopedic Tech Progress Note Patient Details:  Sean Miles Jan 22, 1961 [id number]  Ortho Devices Ortho Device/Splint Location: Trapeze bar Ortho Device/Splint Interventions: Application   Post Interventions Patient Tolerated: Well Instructions Provided: Care of device   Saul Fordyce 04/12/2020, 5:08 PM

## 2020-04-12 NOTE — Anesthesia Postprocedure Evaluation (Signed)
Anesthesia Post Note  Patient: Sean Miles  Procedure(s) Performed: FEMORAL HEAD REVISION (Left Hip)     Patient location during evaluation: PACU Anesthesia Type: Spinal Level of consciousness: awake and alert Pain management: pain level controlled Vital Signs Assessment: post-procedure vital signs reviewed and stable Respiratory status: spontaneous breathing, nonlabored ventilation and respiratory function stable Cardiovascular status: blood pressure returned to baseline and stable Postop Assessment: no apparent nausea or vomiting Anesthetic complications: no   No complications documented.  Last Vitals:  Vitals:   04/12/20 1545 04/12/20 1600  BP: (!) 165/81 (!) 156/86  Pulse: 62 63  Resp: 13 18  Temp:  36.5 C  SpO2: 100% 100%    Last Pain:  Vitals:   04/12/20 1600  TempSrc:   PainSc: 3                  Lowella Curb

## 2020-04-13 ENCOUNTER — Encounter (HOSPITAL_COMMUNITY): Payer: Self-pay | Admitting: Orthopedic Surgery

## 2020-04-13 DIAGNOSIS — T84021A Dislocation of internal left hip prosthesis, initial encounter: Secondary | ICD-10-CM | POA: Diagnosis not present

## 2020-04-13 LAB — BASIC METABOLIC PANEL
Anion gap: 11 (ref 5–15)
BUN: 12 mg/dL (ref 6–20)
CO2: 24 mmol/L (ref 22–32)
Calcium: 8.8 mg/dL — ABNORMAL LOW (ref 8.9–10.3)
Chloride: 104 mmol/L (ref 98–111)
Creatinine, Ser: 0.75 mg/dL (ref 0.61–1.24)
GFR calc Af Amer: >60 mL/min (ref >60)
GFR calc non Af Amer: >60 mL/min (ref >60)
Glucose, Bld: 151 mg/dL — ABNORMAL HIGH (ref 70–99)
Potassium: 3.9 mmol/L (ref 3.5–5.1)
Sodium: 139 mmol/L (ref 135–145)

## 2020-04-13 LAB — CBC
HCT: 31.2 % — ABNORMAL LOW (ref 39.0–52.0)
Hemoglobin: 10.1 g/dL — ABNORMAL LOW (ref 13.0–17.0)
MCH: 27 pg (ref 26.0–34.0)
MCHC: 32.4 g/dL (ref 30.0–36.0)
MCV: 83.4 fL (ref 80.0–100.0)
Platelets: 401 10*3/uL — ABNORMAL HIGH (ref 150–400)
RBC: 3.74 MIL/uL — ABNORMAL LOW (ref 4.22–5.81)
RDW: 13.7 % (ref 11.5–15.5)
WBC: 12.4 10*3/uL — ABNORMAL HIGH (ref 4.0–10.5)
nRBC: 0 % (ref 0.0–0.2)

## 2020-04-13 MED ORDER — HYDROCODONE-ACETAMINOPHEN 5-325 MG PO TABS: 1.0000 | ORAL_TABLET | Freq: Four times a day (QID) | ORAL | 0 refills | Status: AC | PRN

## 2020-04-13 MED ORDER — OXYCODONE HCL 5 MG PO TABS: 5.0000 mg | ORAL_TABLET | Freq: Four times a day (QID) | ORAL | 0 refills | Status: AC | PRN

## 2020-04-13 MED ORDER — ASPIRIN 325 MG PO TBEC: 325.0000 mg | DELAYED_RELEASE_TABLET | Freq: Two times a day (BID) | ORAL | 0 refills | Status: AC

## 2020-04-13 NOTE — Progress Notes (Signed)
   Subjective: 1 Day Post-Op Procedure(s) (LRB): FEMORAL HEAD REVISION (Left) Patient reports pain as mild.   Patient seen in rounds by Dr. Lequita Halt. Patient is well, and has had no acute complaints or problems other than soreness in the left hip. Denies chest pain or SOB. No issues overnight.  We will begin therapy today.   Objective: Vital signs in last 24 hours: Temp:  [97.6 F (36.4 C)-99.5 F (37.5 C)] 98.1 F (36.7 C) (08/24 0429) Pulse Rate:  [58-94] 74 (08/24 0429) Resp:  [13-18] 16 (08/24 0429) BP: (107-190)/(68-95) 156/95 (08/24 0429) SpO2:  [97 %-100 %] 100 % (08/24 0429) Weight:  [114.8 kg] 114.8 kg (08/23 1114)  Intake/Output from previous day:  Intake/Output Summary (Last 24 hours) at 04/13/2020 0801 Last data filed at 04/13/2020 0635 Gross per 24 hour  Intake 3376.75 ml  Output 4325 ml  Net -948.25 ml     Intake/Output this shift: No intake/output data recorded.  Labs: Recent Labs    04/13/20 0330  HGB 10.1*   Recent Labs    04/13/20 0330  WBC 12.4*  RBC 3.74*  HCT 31.2*  PLT 401*   Recent Labs    04/13/20 0330  NA 139  K 3.9  CL 104  CO2 24  BUN 12  CREATININE 0.75  GLUCOSE 151*  CALCIUM 8.8*   No results for input(s): LABPT, INR in the last 72 hours.  Exam: General - Patient is Alert and Oriented Extremity - Neurologically intact Neurovascular intact Sensation intact distally Dorsiflexion/Plantar flexion intact Dressing - dressing C/D/I Motor Function - intact, moving foot and toes well on exam.   Past Medical History:  Diagnosis Date  . A-fib (HCC)   . Anemia    mild prior to first hip surgery   . Anxiety    takes xanax 1- 2 times per day  . Arthritis    psoriatic  . Complication of anesthesia    post anesthesia low sat rates  . Hypercholesteremia   . Hypertension    followed by Dr. Hyacinth Meeker (primary)  . Hypothyroidism    takes armour thyroid  . PONV (postoperative nausea and vomiting)   . Psoriatic arthritis (HCC)     . Sleep apnea     no cpap machine mild per pt     Assessment/Plan: 1 Day Post-Op Procedure(s) (LRB): FEMORAL HEAD REVISION (Left) Principal Problem:   Failed total hip arthroplasty with dislocation (HCC) Active Problems:   Recurrent dislocation of left hip  Estimated body mass index is 36.3 kg/m as calculated from the following:   Height as of this encounter: 5\' 10"  (1.778 m).   Weight as of this encounter: 114.8 kg. Advance diet Up with therapy D/C IV fluids  DVT Prophylaxis - Aspirin Weight bearing as tolerated. Begin therapy.  Plan is to go Home after hospital stay. Discharge after one session of PT this AM. Follow-up in the office September 7th.  06-17-1981, PA-C Orthopedic Surgery [phone] 04/13/2020, 8:01 AM

## 2020-04-13 NOTE — TOC Transition Note (Signed)
Transition of Care [facility]) - CM/SW Discharge Note   Patient Details  Name: Sean Miles MRN: [id number] Date of Birth: Jan 03, 1961  [facility] of [facility] CM/SW Contact:  Clearance Coots, LCSW Phone Number: 04/13/2020, 10:15 AM   Clinical Narrative:    CSW confirm the patient has a RW. Patient declines 3 in 1.     Final next level of care: Home/Self Care Barriers to Discharge: No Barriers Identified   Patient Goals and CMS Choice     Choice offered to / list presented to : NA  Discharge Placement                       Discharge Plan and Services                DME Arranged: N/A DME Agency: NA         HH Agency: NA        Social Determinants of Health (SDOH) Interventions     Readmission Risk Interventions No flowsheet data found.

## 2020-04-13 NOTE — Evaluation (Signed)
Physical Therapy One Time Evaluation Patient Details Name: Sean Miles MRN: [id number] DOB: 1961/07/02 Today's Date: 04/13/2020   History of Present Illness  Pt is a 59 year old male s/p Left femoral head revision due to recurrent dislocation of L THA  Clinical Impression  Patient evaluated by Physical Therapy with no further acute PT needs identified. All education has been completed and the patient has no further questions.  Pt ambulated in hallway and practiced safe stair technique.  Pt self reports no bending precautions and had no further questions.  Pt feels ready for d/c home today. See below for any follow-up Physical Therapy or equipment needs. PT is signing off. Thank you for this referral.     Follow Up Recommendations Follow surgeon's recommendation for DC plan and follow-up therapies    Equipment Recommendations  None recommended by PT    Recommendations for Other Services       Precautions / Restrictions Precautions Precautions: Fall Precaution Comments: no bending past 90* per pt (this is how is dislocated) - direct anterior approach for THA and revision Restrictions Weight Bearing Restrictions: No      Mobility  Bed Mobility Overal bed mobility: Modified Independent                Transfers Overall transfer level: Needs assistance Equipment used: Rolling walker (2 wheeled) Transfers: Sit to/from Stand Sit to Stand: Supervision         General transfer comment: supervision for safety  Ambulation/Gait Ambulation/Gait assistance: Min guard;Supervision Gait Distance (Feet): 240 Feet Assistive device: Rolling walker (2 wheeled) Gait Pattern/deviations: Step-through pattern;Decreased stance time - left;Antalgic     General Gait Details: good use of RW, pt reports mild pain  Stairs Stairs: Yes Stairs assistance: Min guard Stair Management: Step to pattern;Forwards;One rail Right Number of Stairs: 3 General stair comments: verbal cues for  sequence and safety; pt performed well  Wheelchair Mobility    Modified Rankin (Stroke Patients Only)       Balance                                             Pertinent Vitals/Pain Pain Assessment: No/denies pain    Home Living Family/patient expects to be discharged to:: Private residence Living Arrangements: Spouse/significant other Available Help at Discharge: Family;Available 24 hours/day Type of Home: House Home Access: Stairs to enter Entrance Stairs-Rails: Right Entrance Stairs-Number of Steps: 3 Home Layout: Able to live on main level with bedroom/bathroom Home Equipment: Walker - 2 wheels      Prior Function Level of Independence: Independent               Hand Dominance        Extremity/Trunk Assessment        Lower Extremity Assessment Lower Extremity Assessment: LLE deficits/detail LLE Deficits / Details: avoid hip flexion past 90*, grossly appears at least 2+/5 throughout hip strength       Communication   Communication: No difficulties  Cognition Arousal/Alertness: Awake/alert Behavior During Therapy: WFL for tasks assessed/performed Overall Cognitive Status: Within Functional Limits for tasks assessed                                        General Comments      Exercises  Assessment/Plan    PT Assessment Patent does not need any further PT services  PT Problem List         PT Treatment Interventions      PT Goals (Current goals can be found in the Care Plan section)  Acute Rehab PT Goals PT Goal Formulation: All assessment and education complete, DC therapy    Frequency     Barriers to discharge        Co-evaluation               AM-PAC PT "6 Clicks" Mobility  Outcome Measure Help needed turning from your back to your side while in a flat bed without using bedrails?: None Help needed moving from lying on your back to sitting on the side of a flat bed without using  bedrails?: None Help needed moving to and from a bed to a chair (including a wheelchair)?: None Help needed standing up from a chair using your arms (e.g., wheelchair or bedside chair)?: None Help needed to walk in hospital room?: A Little Help needed climbing 3-5 steps with a railing? : A Little 6 Click Score: 22    End of Session Equipment Utilized During Treatment: Gait belt Activity Tolerance: Patient tolerated treatment well Patient left: in chair;with call bell/phone within reach Nurse Communication: Mobility status PT Visit Diagnosis: Difficulty in walking, not elsewhere classified (R26.2)    Time: 1308-6578 PT Time Calculation (min) (ACUTE ONLY): 16 min   Charges:   PT Evaluation $PT Eval Low Complexity: 1 Low     Kati PT, DPT Acute Rehabilitation Services Pager: [phone] Office: [phone]  Eunie Lawn,[name] 04/13/2020, 10:45 AM

## 2020-08-22 ENCOUNTER — Other Ambulatory Visit: Payer: Self-pay | Admitting: Cardiovascular Disease

## 2021-08-17 IMAGING — DX DG PORTABLE PELVIS
2 series · 2 of 2 positions shown · non-contrast
Comparison: 03/23/2020

CLINICAL DATA: Post left hip surgery

EXAM:
PORTABLE PELVIS 1-2 VIEWS

[pelvis ap (1 of 2)]
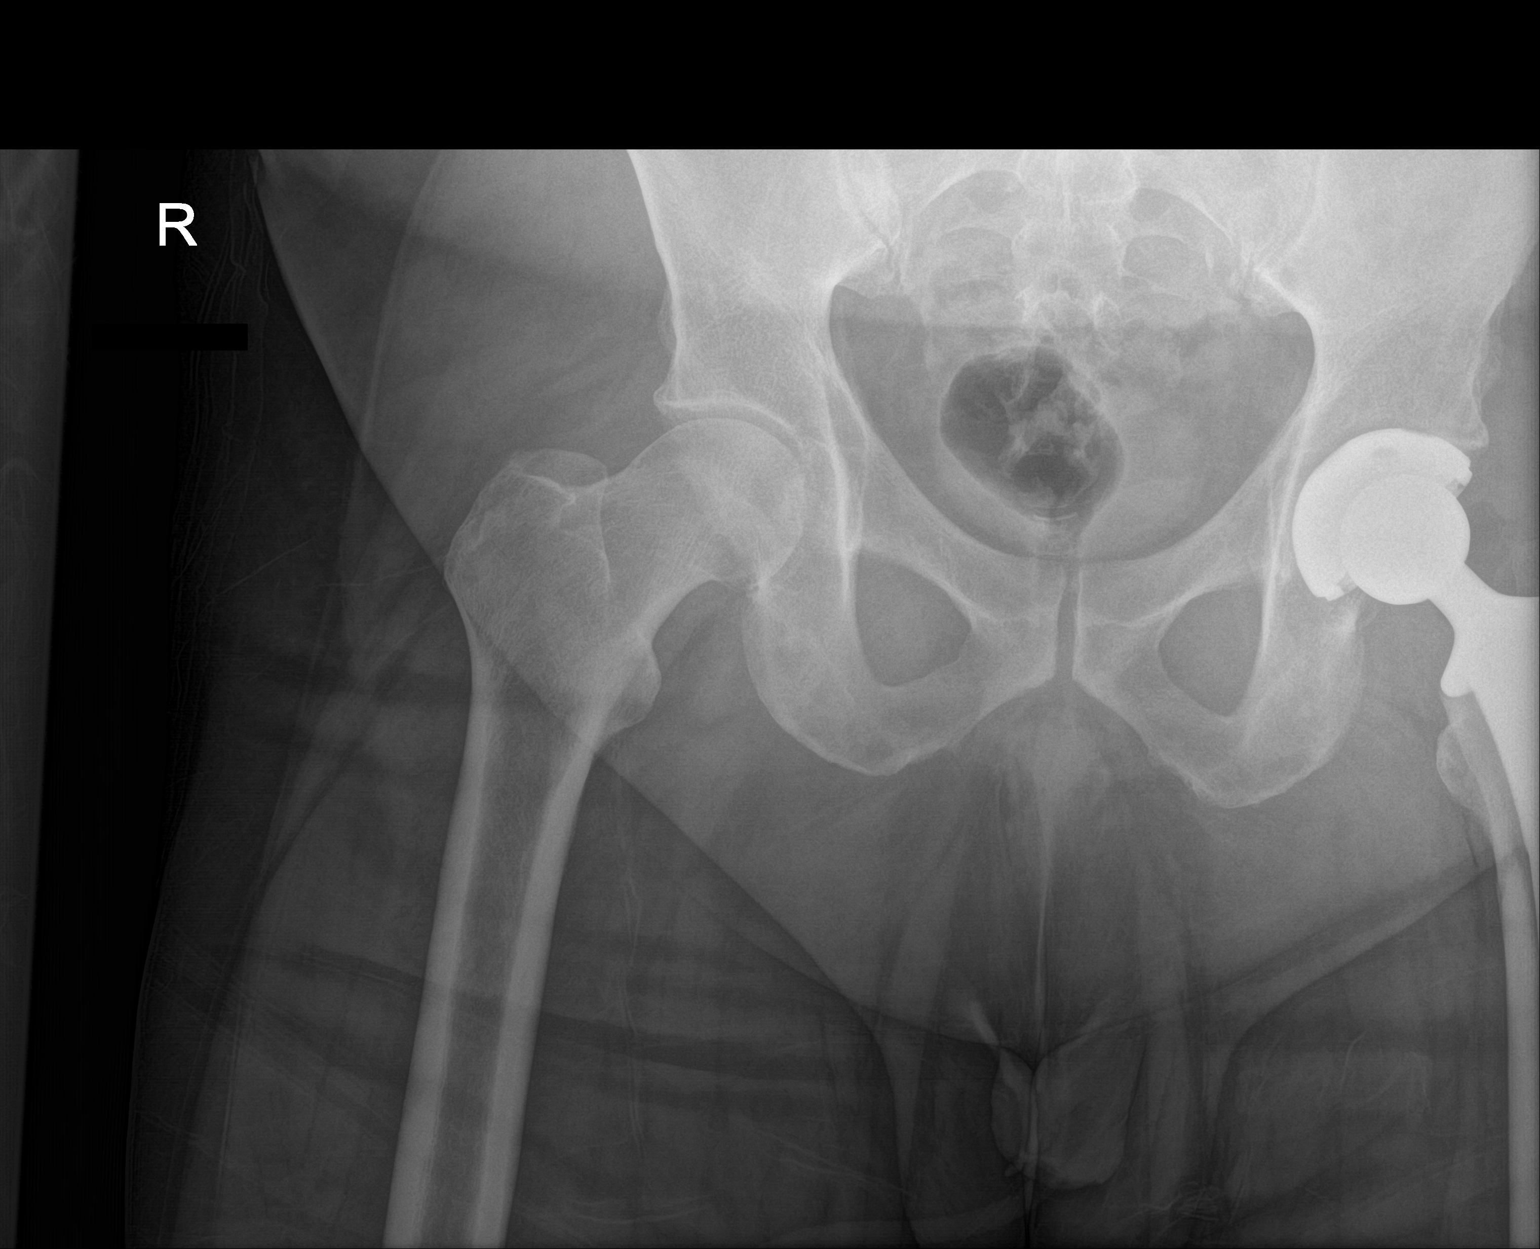

[pelvis ap (2 of 2)]
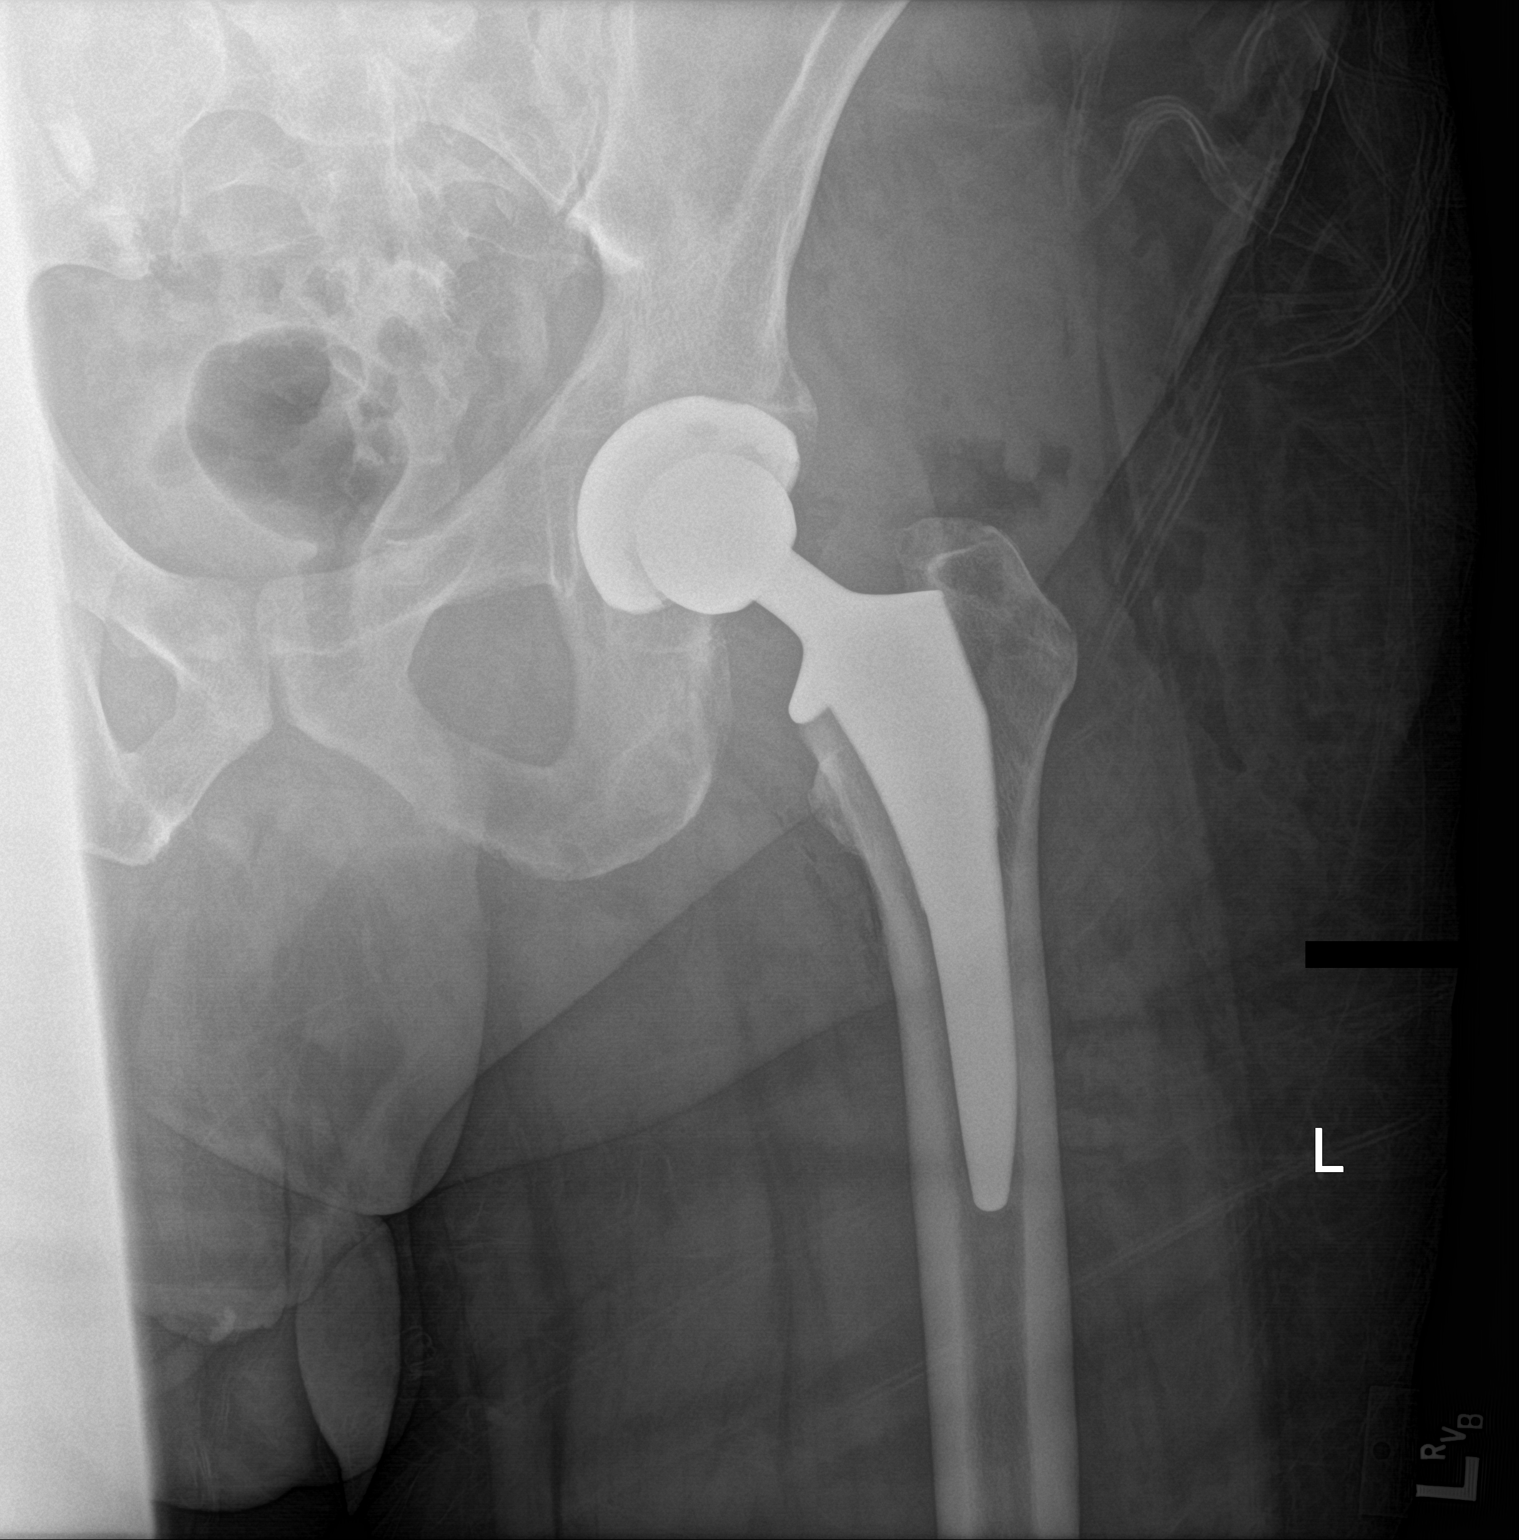

[2 of 2 positions shown; findings below may reference images not displayed]

FINDINGS: Changes of left hip replacement. Normal AP alignment. No hardware
bony complicating feature.
IMPRESSION: Left hip replacement.  No visible complicating feature.

## 2021-08-17 IMAGING — RF DG HIP (WITH PELVIS) OPERATIVE*L*
1 series · 2 of 2 positions shown · non-contrast
Comparison: 03/23/2020

CLINICAL DATA: Left femoral head revision

EXAM:
OPERATIVE left HIP (WITH PELVIS IF PERFORMED)  VIEWS
TECHNIQUE: Fluoroscopic spot image(s) were submitted for interpretation
post-operatively.

[Series 1: unknown protocol · 0.20mm/px · 2 of 2 slices shown]
[im 1/2]
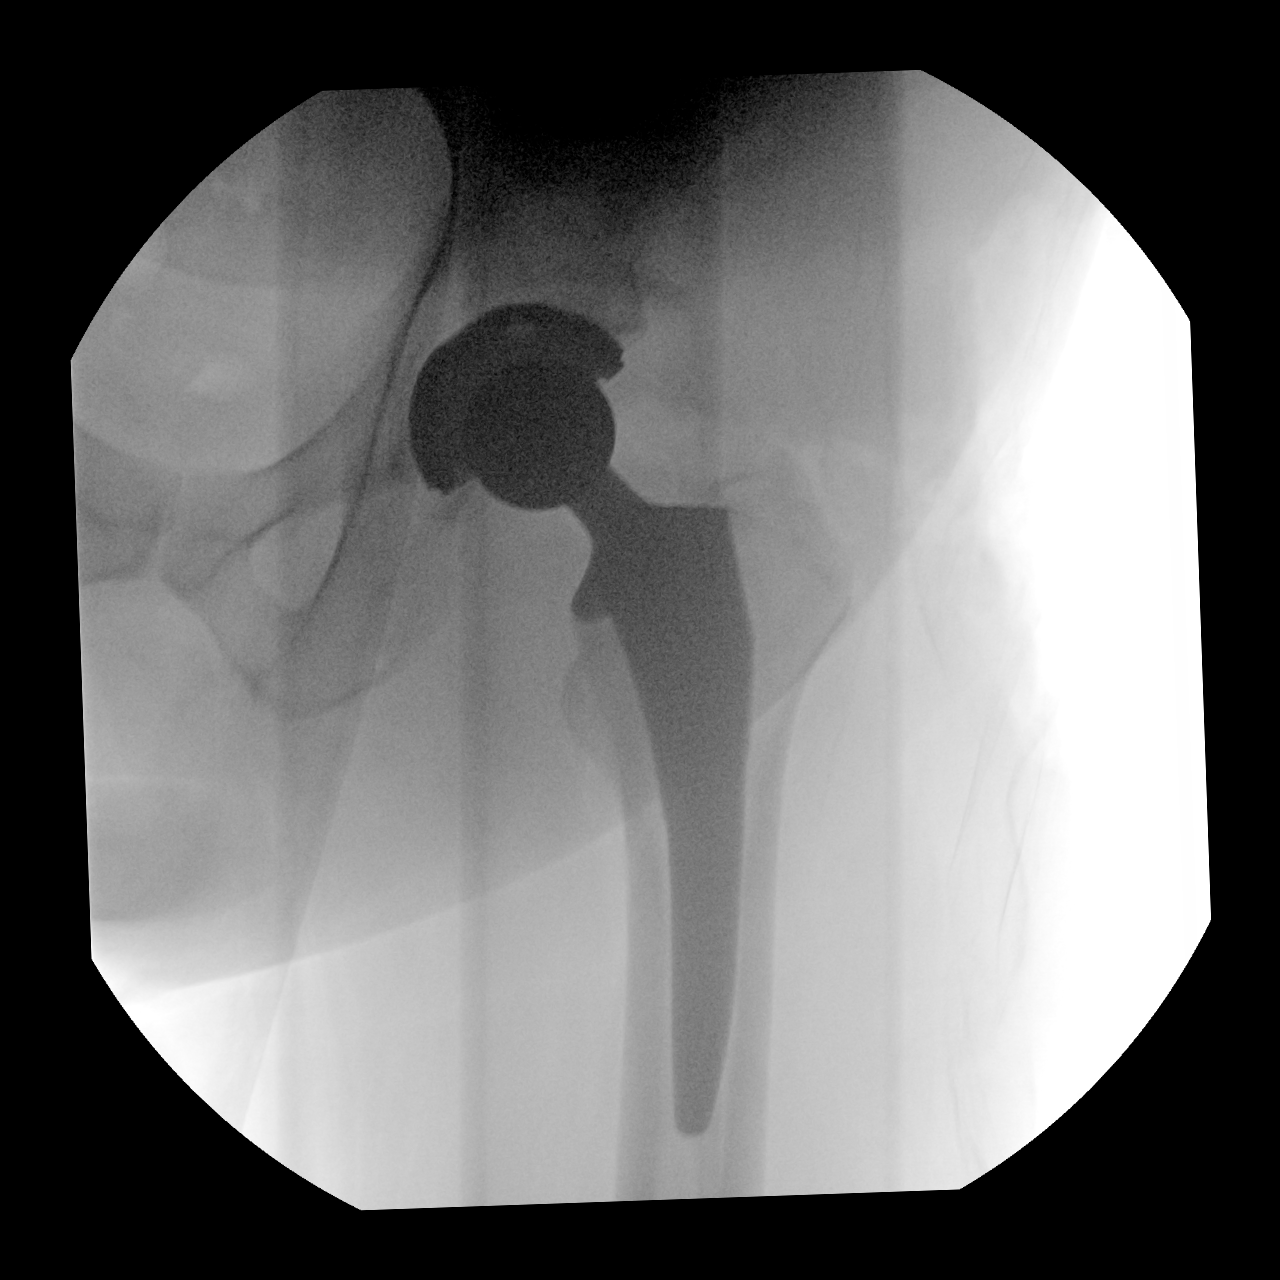
[im 2/2]
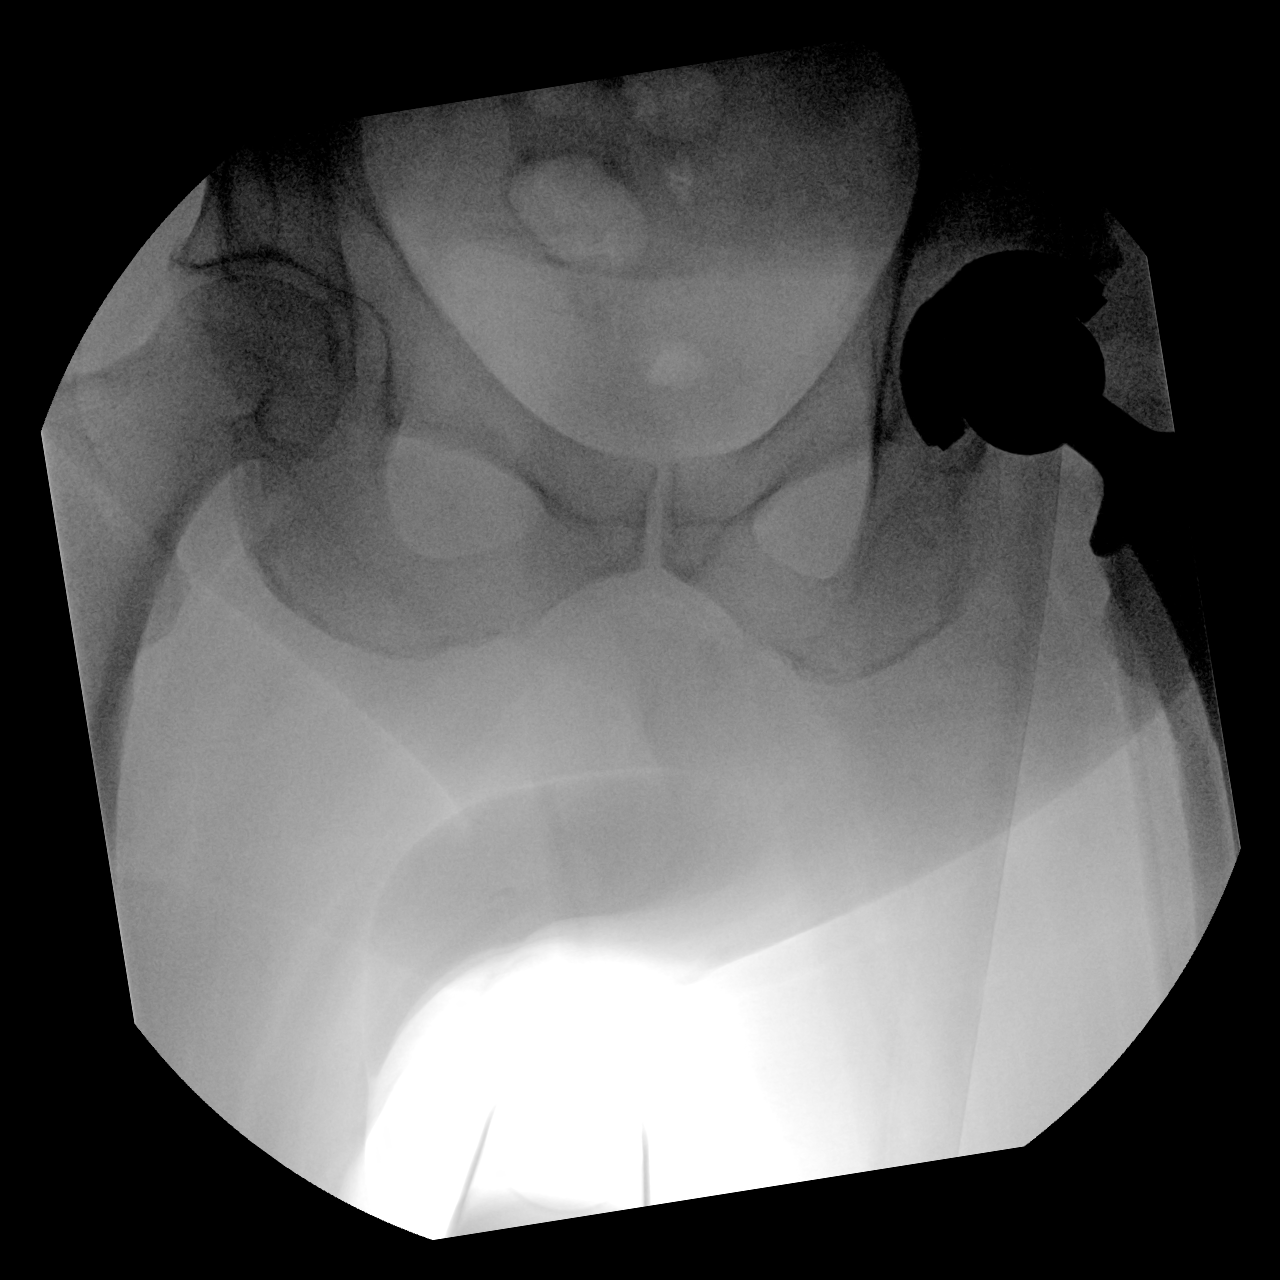

[2 of 2 positions shown; findings below may reference images not displayed]

FINDINGS: Two fluoroscopic spot images demonstrate a left total hip
arthroplasty. No evidence of malalignment. No unexpected findings.
IMPRESSION: Two fluoroscopic spot images demonstrate a left total hip
arthroplasty. Please see prior radiographs and the procedure note
for further evaluation.
